# Patient Record
Sex: Female | Born: 1985 | Race: Black or African American | Hispanic: No | Marital: Single
Health system: Midwestern US, Community
[De-identification: ages and names within clinical notes are randomized; demographics above are authoritative.]

## PROBLEM LIST (undated history)

## (undated) DIAGNOSIS — I10 Essential (primary) hypertension: Secondary | ICD-10-CM

## (undated) DIAGNOSIS — E7800 Pure hypercholesterolemia, unspecified: Secondary | ICD-10-CM

## (undated) DIAGNOSIS — N83209 Unspecified ovarian cyst, unspecified side: Secondary | ICD-10-CM

---
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
## 2009-05-02 ENCOUNTER — Emergency Department (HOSPITAL_COMMUNITY): Admission: EM | Admit: 2009-05-02 | Discharge: 2009-05-02 | Payer: Self-pay | Admitting: Emergency Medicine

## 2009-07-20 ENCOUNTER — Emergency Department (HOSPITAL_COMMUNITY): Admission: EM | Admit: 2009-07-20 | Discharge: 2009-07-20 | Payer: Self-pay | Admitting: Emergency Medicine

## 2009-09-28 ENCOUNTER — Emergency Department (HOSPITAL_COMMUNITY): Admission: EM | Admit: 2009-09-28 | Discharge: 2009-09-29 | Payer: Self-pay | Admitting: Emergency Medicine

## 2009-09-28 ENCOUNTER — Emergency Department (HOSPITAL_COMMUNITY): Admission: EM | Admit: 2009-09-28 | Discharge: 2009-09-28 | Payer: Self-pay | Admitting: Emergency Medicine

## 2009-10-03 ENCOUNTER — Emergency Department (HOSPITAL_COMMUNITY): Admission: EM | Admit: 2009-10-03 | Discharge: 2009-10-03 | Payer: Self-pay | Admitting: Emergency Medicine

## 2010-09-13 LAB — URINALYSIS, ROUTINE W REFLEX MICROSCOPIC
Bilirubin Urine: NEGATIVE
Glucose, UA: NEGATIVE mg/dL
Ketones, ur: NEGATIVE mg/dL
Nitrite: NEGATIVE
Specific Gravity, Urine: 1.017 (ref 1.005–1.030)
Urobilinogen, UA: 0.2 mg/dL (ref 0.0–1.0)

## 2010-09-13 LAB — POCT PREGNANCY, URINE: Preg Test, Ur: NEGATIVE

## 2010-09-13 LAB — WET PREP, GENITAL
Clue Cells Wet Prep HPF POC: NONE SEEN
Trich, Wet Prep: NONE SEEN

## 2010-09-13 LAB — GC/CHLAMYDIA PROBE AMP, GENITAL: Chlamydia, DNA Probe: NEGATIVE

## 2010-09-13 LAB — RPR: RPR Ser Ql: NONREACTIVE

## 2010-09-27 LAB — WET PREP, GENITAL: Yeast Wet Prep HPF POC: NONE SEEN

## 2010-09-27 LAB — URINE MICROSCOPIC-ADD ON

## 2010-09-27 LAB — GC/CHLAMYDIA PROBE AMP, GENITAL
Chlamydia, DNA Probe: NEGATIVE
GC Probe Amp, Genital: NEGATIVE

## 2010-09-27 LAB — RAPID STREP SCREEN (MED CTR MEBANE ONLY): Streptococcus, Group A Screen (Direct): POSITIVE — AB

## 2010-09-27 LAB — URINALYSIS, ROUTINE W REFLEX MICROSCOPIC
Protein, ur: 30 mg/dL — AB
Urobilinogen, UA: 1 mg/dL (ref 0.0–1.0)

## 2010-09-27 LAB — POCT PREGNANCY, URINE: Preg Test, Ur: NEGATIVE

## 2010-12-07 ENCOUNTER — Emergency Department (HOSPITAL_COMMUNITY)
Admission: EM | Admit: 2010-12-07 | Discharge: 2010-12-07 | Disposition: A | Discharge status: Home / Self Care | Payer: Self-pay | Attending: Emergency Medicine | Admitting: Emergency Medicine

## 2010-12-07 DIAGNOSIS — N751 Abscess of Bartholin's gland: Secondary | ICD-10-CM | POA: Insufficient documentation

## 2010-12-07 DIAGNOSIS — K029 Dental caries, unspecified: Secondary | ICD-10-CM | POA: Insufficient documentation

## 2010-12-07 DIAGNOSIS — K089 Disorder of teeth and supporting structures, unspecified: Secondary | ICD-10-CM | POA: Insufficient documentation

## 2010-12-07 DIAGNOSIS — E119 Type 2 diabetes mellitus without complications: Secondary | ICD-10-CM | POA: Insufficient documentation

## 2010-12-07 LAB — URINALYSIS, ROUTINE W REFLEX MICROSCOPIC
Glucose, UA: NEGATIVE mg/dL
Ketones, ur: NEGATIVE mg/dL
Protein, ur: NEGATIVE mg/dL
Urobilinogen, UA: 0.2 mg/dL (ref 0.0–1.0)
pH: 5.5 (ref 5.0–8.0)

## 2010-12-07 LAB — POCT PREGNANCY, URINE: Preg Test, Ur: NEGATIVE

## 2011-04-23 ENCOUNTER — Emergency Department (HOSPITAL_COMMUNITY)
Admission: EM | Admit: 2011-04-23 | Discharge: 2011-04-23 | Disposition: A | Discharge status: Home / Self Care | Payer: Self-pay | Attending: Emergency Medicine | Admitting: Emergency Medicine

## 2011-04-23 DIAGNOSIS — N390 Urinary tract infection, site not specified: Secondary | ICD-10-CM | POA: Insufficient documentation

## 2011-04-23 DIAGNOSIS — R300 Dysuria: Secondary | ICD-10-CM | POA: Insufficient documentation

## 2011-04-23 DIAGNOSIS — J45909 Unspecified asthma, uncomplicated: Secondary | ICD-10-CM | POA: Insufficient documentation

## 2011-04-23 LAB — URINALYSIS, ROUTINE W REFLEX MICROSCOPIC
Glucose, UA: NEGATIVE mg/dL
Ketones, ur: NEGATIVE mg/dL
Protein, ur: NEGATIVE mg/dL
Specific Gravity, Urine: 1.02 (ref 1.005–1.030)
Urobilinogen, UA: 0.2 mg/dL (ref 0.0–1.0)
pH: 5.5 (ref 5.0–8.0)

## 2015-08-22 ENCOUNTER — Emergency Department (HOSPITAL_COMMUNITY)
Admission: EM | Admit: 2015-08-22 | Discharge: 2015-08-22 | Disposition: A | Discharge status: Home / Self Care | Payer: Self-pay | Attending: Emergency Medicine | Admitting: Emergency Medicine

## 2015-08-22 ENCOUNTER — Encounter (HOSPITAL_COMMUNITY): Payer: Self-pay | Admitting: Family Medicine

## 2015-08-22 DIAGNOSIS — N750 Cyst of Bartholin's gland: Secondary | ICD-10-CM | POA: Insufficient documentation

## 2015-08-22 DIAGNOSIS — F17210 Nicotine dependence, cigarettes, uncomplicated: Secondary | ICD-10-CM | POA: Insufficient documentation

## 2015-08-22 MED ORDER — TRAMADOL HCL 50 MG PO TABS: 50.0000 mg | ORAL_TABLET | Freq: Four times a day (QID) | ORAL | Status: DC | PRN

## 2015-08-22 MED ORDER — LIDOCAINE-EPINEPHRINE 2 %-1:100000 IJ SOLN
20.0000 mL | Freq: Once | INTRAMUSCULAR | Status: DC
  Filled 2015-08-22: qty 1

## 2015-08-22 MED ORDER — CEPHALEXIN 500 MG PO CAPS: 500.0000 mg | ORAL_CAPSULE | Freq: Four times a day (QID) | ORAL | Status: DC

## 2015-08-22 MED ORDER — CEPHALEXIN 500 MG PO CAPS: 500.0000 mg | ORAL_CAPSULE | Freq: Two times a day (BID) | ORAL | Status: DC

## 2015-08-22 NOTE — ED Notes (Signed)
Patient reports having an abscess to her left groin since Thursday. Denies fever or drainage. Pt reports she has not took any medication for the symptoms.

## 2015-08-22 NOTE — ED Provider Notes (Signed)
CSN: [id number]     Arrival date & time 08/22/15  0243 History   First MD Initiated Contact with Patient 08/22/15 [phone]     Chief Complaint  Patient presents with  . Abscess     (Consider location/radiation/quality/duration/timing/severity/associated sxs/prior Treatment) HPI   Cathy Reid is a 30 y.o. female  PCP: No PCP Per Patient  Blood pressure 137/68, pulse 86, temperature 98.9 F (37.2 C), temperature source Oral, resp. rate 20, height  (1.6 m), SpO2 100 %.  SIGNIFICANT PMH: none CHIEF COMPLAINT: bump on left labia  When: Since this past thursday Mechanism: insidious Chronicity: first time Location: left labia Radiation: none Quality and severity: severe pain Treatments tried: soaking in sitz baths Alleviating factors: not touching the area Worsening factors: touching the area Associated Symptoms: pain Risk Factors: shaving her inguinal region  Negative ROS: Confusion, diaphoresis, fever, headache, weakness (general or focal), change of vision,  neck pain, dysphagia, aphagia, chest pain, shortness of breath,  back pain, abdominal pains, nausea, vomiting, diarrhea, lower extremity swelling, rash.   History reviewed. No pertinent past medical history. History reviewed. No pertinent past surgical history. History reviewed. No pertinent family history. Social History  Substance Use Topics  . Smoking status: Current Every Day Smoker    Types: Cigarettes  . Smokeless tobacco: None  . Alcohol Use: No   OB History    No data available     Review of Systems  Review of Systems All other systems negative except as documented in the HPI. All pertinent positives and negatives as reviewed in the HPI.   Allergies  Review of patient's allergies indicates no known allergies.  Home Medications   Prior to Admission medications   Medication Sig Start Date End Date Taking? Authorizing Provider  cephALEXin (KEFLEX) 500 MG capsule Take 1 capsule (500 mg total)  by mouth 2 (two) times daily. 08/22/15   Willford Rabideau Neva Seat, PA-C  traMADol (ULTRAM) 50 MG tablet Take 1 tablet (50 mg total) by mouth every 6 (six) hours as needed. 08/22/15   Sherod Cisse Neva Seat, PA-C   BP 137/68 mmHg  Pulse 86  Temp(Src) 98.9 F (37.2 C) (Oral)  Resp 20  Ht  (1.6 m)  SpO2 100%  LMP  Physical Exam  Constitutional: She appears well-developed and well-nourished. No distress.  HENT:  Head: Normocephalic and atraumatic.  Eyes: Pupils are equal, round, and reactive to light.  Neck: Normal range of motion. Neck supple.  Cardiovascular: Normal rate and regular rhythm.   Pulmonary/Chest: Effort normal.  Abdominal: Soft.  Genitourinary:     Neurological: She is alert.  Skin: Skin is warm and dry.  Nursing note and vitals reviewed.   ED Course  Procedures (including critical care time) Labs Review Labs Reviewed - No data to display  Imaging Review No results found. I have personally reviewed and evaluated these images and lab results as part of my medical decision-making.   EKG Interpretation None      MDM   Final diagnoses:  Bartholin cyst    INCISION AND DRAINAGE Performed by: Dorthula Matas Consent: Verbal consent obtained. Risks and benefits: risks, benefits and alternatives were discussed Type: abscess  Body area: left labia majora  Anesthesia: local infiltration  Incision was made with a scalpel.  Local anesthetic: lidocaine 2% with epinephrine  Anesthetic total: 2 ml  Complexity: complex Blunt dissection to break up loculations  Drainage: purulent  Drainage amount: moderate, 3 cc's  Packing materialnot large enough for packing  Patient  tolerance: Patient tolerated the procedure well with no immediate complications.  Patient started on antibiotic and given a prescription for pain medicine. She is getting given a referral to [facility] and wound care instructions. She is also been given strict return to emergency Department  precautions.   Marlon Pel, PA-C 08/22/15 0502  Derwood Kaplan, MD 08/22/15 1610

## 2015-08-22 NOTE — ED Notes (Signed)
Provider in room  

## 2015-08-22 NOTE — ED Notes (Signed)
Bed: WA06 Expected date:  Expected time:  Means of arrival:  Comments: 

## 2015-08-22 NOTE — Discharge Instructions (Signed)
Bartholin Cyst or Abscess A Bartholin cyst is a fluid-filled sac that forms on a Bartholin gland. Bartholin glands are small glands that are located within the folds of skin (labia) along the sides of the lower opening of the vagina. These glands produce a fluid to moisten the outside of the vagina during sexual intercourse. A Bartholin cyst causes a bulge on the side of the vagina. A cyst that is not large or infected may not cause symptoms or problems. However, if the fluid within the cyst becomes infected, the cyst can turn into an abscess. An abscess may cause discomfort or pain. CAUSES A Bartholin cyst may develop when the duct of the gland becomes blocked. In many cases, the cause of this is not known. Various kinds of bacteria can cause the cyst to become infected and develop into an abscess. RISK FACTORS You may be at an increased risk of developing a Bartholin cyst or abscess if:  You are a woman of reproductive age.  You have a history of previous Bartholin cysts or abscesses.  You have diabetes.  You have a sexually transmitted disease (STD). SIGNS AND SYMPTOMS The severity of symptoms varies depending on the size of the cyst and whether it is infected. Symptoms may include:  A bulge or swelling near the lower opening of your vagina.  Discomfort or pain.  Redness.  Pain during sexual intercourse.  Pain when walking.  Fluid draining from the area. DIAGNOSIS Your health care provider may make a diagnosis based on your symptoms and a physical exam. He or she will look for swelling in your vaginal area. Blood tests may be done to check for infections. A sample of fluid from the cyst or abscess may also be taken to be tested in a lab. TREATMENT Small cysts that are not infected may not require any treatment. These often go away on their own. Yourhealth care provider will recommend hot baths and the use of warm compresses. These may also be part of the treatment for an abscess.  Treatment options for a large cyst or abscess may include:   Antibiotic medicine.  A surgical procedure to drain the abscess. One of the following procedures may be done:  Incision and drainage. An incision is made in the cyst or abscess so that the fluid drains out. A catheter may be placed inside the cyst so that it does not close and fill up with fluid again. The catheter will be removed after you have a follow-up visit with a specialist (gynecologist).  Marsupialization. The cyst or abscess is opened and kept open by stitching the edges of the skin to the walls of the cyst or abscess. This allows it to continue to drain and not fill up with fluid again. If you have cysts or abscesses that keep returning and have required incision and drainage multiple times, your health care provider may talk to you about surgery to remove the Bartholin gland. HOME CARE INSTRUCTIONS  Take medicines only as directed by your health care provider.  If you were prescribed an antibiotic medicine, finish it all even if you start to feel better.  Apply warm, wet compresses to the area or take warm, shallow baths that cover your pelvic region (sitz baths) several times a day or as directed by your health care provider.  Do not squeeze the cyst or apply heavy pressure to it.  Do not have sexual intercourse until the cyst has gone away.  If your cyst or abscess was   opened, a small piece of gauze or a drain may have been placed in the area to allow drainage. Do not remove the gauze or the drain until directed by your health care provider.  Wear feminine pads--not tampons--as needed for any drainage or bleeding.  Keep all follow-up visits as directed by your health care provider. This is important. PREVENTION Take these steps to help prevent a Bartholin cyst from returning:  Practice good hygiene.   Clean your vaginal area with mild soap and a soft cloth when you bathe.  Practice safe sex to prevent  STDs. SEEK MEDICAL CARE IF:  You have increased pain, swelling, or redness in the area of the cyst.  Puslike drainage is coming from the cyst.  You have a fever.   This information is not intended to replace advice given to you by your health care provider. Make sure you discuss any questions you have with your health care provider.   Document Released: 06/11/2005 Document Revised: 07/02/2014 Document Reviewed: 01/25/2014 Elsevier Interactive Patient Education 2016 [employer]  

## 2015-11-09 ENCOUNTER — Emergency Department (HOSPITAL_COMMUNITY): Payer: Self-pay

## 2015-11-09 ENCOUNTER — Encounter (HOSPITAL_COMMUNITY): Payer: Self-pay | Admitting: Emergency Medicine

## 2015-11-09 ENCOUNTER — Emergency Department (HOSPITAL_COMMUNITY)
Admission: EM | Admit: 2015-11-09 | Discharge: 2015-11-09 | Disposition: A | Discharge status: Home / Self Care | Payer: Self-pay | Attending: Emergency Medicine | Admitting: Emergency Medicine

## 2015-11-09 DIAGNOSIS — R0789 Other chest pain: Secondary | ICD-10-CM | POA: Insufficient documentation

## 2015-11-09 DIAGNOSIS — F17210 Nicotine dependence, cigarettes, uncomplicated: Secondary | ICD-10-CM | POA: Insufficient documentation

## 2015-11-09 DIAGNOSIS — Z792 Long term (current) use of antibiotics: Secondary | ICD-10-CM | POA: Insufficient documentation

## 2015-11-09 LAB — COMPREHENSIVE METABOLIC PANEL
ALT: 10 U/L — ABNORMAL LOW (ref 14–54)
AST: 15 U/L (ref 15–41)
Albumin: 3.5 g/dL (ref 3.5–5.0)
Alkaline Phosphatase: 47 U/L (ref 38–126)
Anion gap: 10 (ref 5–15)
BUN: 7 mg/dL (ref 6–20)
CO2: 23 mmol/L (ref 22–32)
Calcium: 8.9 mg/dL (ref 8.9–10.3)
Chloride: 105 mmol/L (ref 101–111)
Creatinine, Ser: 0.93 mg/dL (ref 0.44–1.00)
GFR calc Af Amer: >60 mL/min (ref >60)
GFR calc non Af Amer: >60 mL/min (ref >60)
Glucose, Bld: 84 mg/dL (ref 65–99)
Potassium: 4.1 mmol/L (ref 3.5–5.1)
Sodium: 138 mmol/L (ref 135–145)
Total Bilirubin: 0.4 mg/dL (ref 0.3–1.2)
Total Protein: 6.9 g/dL (ref 6.5–8.1)

## 2015-11-09 LAB — CBC
HCT: 32.8 % — ABNORMAL LOW (ref 36.0–46.0)
Hemoglobin: 9.9 g/dL — ABNORMAL LOW (ref 12.0–15.0)
MCH: 18.1 pg — ABNORMAL LOW (ref 26.0–34.0)
MCHC: 30.2 g/dL (ref 30.0–36.0)
MCV: 59.9 fL — ABNORMAL LOW (ref 78.0–100.0)
Platelets: 400 10*3/uL (ref 150–400)
RBC: 5.48 MIL/uL — ABNORMAL HIGH (ref 3.87–5.11)
RDW: 19.8 % — ABNORMAL HIGH (ref 11.5–15.5)
WBC: 19 10*3/uL — ABNORMAL HIGH (ref 4.0–10.5)

## 2015-11-09 LAB — I-STAT TROPONIN, ED: Troponin i, poc: 0 ng/mL (ref 0.00–0.08)

## 2015-11-09 LAB — LIPASE, BLOOD: Lipase: 45 U/L (ref 11–51)

## 2015-11-09 MED ORDER — PANTOPRAZOLE SODIUM 20 MG PO TBEC: 20.0000 mg | DELAYED_RELEASE_TABLET | Freq: Two times a day (BID) | ORAL | Status: DC

## 2015-11-09 NOTE — ED Notes (Signed)
Per EMS, pt has been having epigastric pain radiating to her chest x 3 days. Pt alert x 4, NAD at this time. Pt took 324 of aspirin at home.

## 2015-11-09 NOTE — ED Provider Notes (Signed)
CSN: [id number]     Arrival date & time 11/09/15  1712 History   First MD Initiated Contact with Patient 11/09/15 2113     Chief Complaint  Patient presents with  . Abdominal Pain     (Consider location/radiation/quality/duration/timing/severity/associated sxs/prior Treatment) HPI   29yF with CP. Onset about 3 days ago. Burning/aching pain in epigastrium to mid sternum. Fairly constant since onset. No appreciable exacerbating or relieving factors. No respiratory complaints. No n/v. No unusual leg pain or swelling.   History reviewed. No pertinent past medical history. History reviewed. No pertinent past surgical history. No family history on file. Social History  Substance Use Topics  . Smoking status: Current Every Day Smoker    Types: Cigarettes  . Smokeless tobacco: None  . Alcohol Use: No   OB History    No data available     Review of Systems  All systems reviewed and negative, other than as noted in HPI.   Allergies  Review of patient's allergies indicates no known allergies.  Home Medications   Prior to Admission medications   Medication Sig Start Date End Date Taking? Authorizing Provider  aspirin EC 81 MG tablet Take 324 mg by mouth every 6 (six) hours as needed for moderate pain.   Yes Historical Provider, MD  cephALEXin (KEFLEX) 500 MG capsule Take 1 capsule (500 mg total) by mouth 2 (two) times daily. 08/22/15   Tiffany Neva Seat, PA-C  pantoprazole (PROTONIX) 20 MG tablet Take 1 tablet (20 mg total) by mouth 2 (two) times daily before a meal. 11/09/15   Raeford Razor, MD  traMADol (ULTRAM) 50 MG tablet Take 1 tablet (50 mg total) by mouth every 6 (six) hours as needed. 08/22/15   Tiffany Neva Seat, PA-C   BP 121/59 mmHg  Pulse 74  Temp(Src) 99.2 F (37.3 C) (Oral)  Resp 16  SpO2 99%  LMP 09/26/2015 (Exact Date) Physical Exam  Constitutional: She appears well-developed and well-nourished. No distress.  HENT:  Head: Normocephalic and atraumatic.  Eyes:  Conjunctivae are normal. Right eye exhibits no discharge. Left eye exhibits no discharge.  Neck: Neck supple.  Cardiovascular: Normal rate, regular rhythm and normal heart sounds.  Exam reveals no gallop and no friction rub.   No murmur heard. Pulmonary/Chest: Effort normal and breath sounds normal. No respiratory distress. She exhibits no tenderness.  Abdominal: Soft. She exhibits no distension. There is no tenderness.  Musculoskeletal: She exhibits no edema or tenderness.  Lower extremities symmetric as compared to each other. No calf tenderness. Negative Homan's. No palpable cords.   Neurological: She is alert.  Skin: Skin is warm and dry.  Psychiatric: She has a normal mood and affect. Her behavior is normal. Thought content normal.  Nursing note and vitals reviewed.   ED Course  Procedures (including critical care time) Labs Review Labs Reviewed  COMPREHENSIVE METABOLIC PANEL - Abnormal; Notable for the following:    ALT 10 (*)    All other components within normal limits  CBC - Abnormal; Notable for the following:    WBC 19.0 (*)    RBC 5.48 (*)    Hemoglobin 9.9 (*)    HCT 32.8 (*)    MCV 59.9 (*)    MCH 18.1 (*)    RDW 19.8 (*)    All other components within normal limits  LIPASE, BLOOD  I-STAT TROPOININ, ED    Imaging Review Dg Chest 2 View  11/09/2015  CLINICAL DATA:  Epigastric pain radiating to chest for 3 days,  chest pain, smoker EXAM: CHEST  2 VIEW COMPARISON:  09/28/2009 FINDINGS: Normal heart size and pulmonary vascularity. Stable small epicardial fat pad at RIGHT cardiophrenic angle. Mediastinal contours otherwise normal. Lungs clear. No pleural effusion or pneumothorax. Bones unremarkable. IMPRESSION: No acute abnormalities. Electronically Signed   By: Ulyses SouthwardMark  Boles M.D.   On: 11/09/2015 18:28   I have personally reviewed and evaluated these images and lab results as part of my medical decision-making.   EKG Interpretation None      MDM   Final  diagnoses:  Chest discomfort    29yF with CP. Doubt ACS, PE, dissection or other emergent process. Suspect may be gerd. Trial of PPI. It has been determined that no acute conditions requiring further emergency intervention are present at this time. The patient has been advised of the diagnosis and plan. I reviewed any labs and imaging including any potential incidental findings. We have discussed signs and symptoms that warrant return to the ED and they are listed in the discharge instructions.      Raeford RazorStephen Atlas Crossland, MD 11/18/15 [phone]

## 2015-11-09 NOTE — ED Notes (Signed)
MD at bedside. 

## 2016-02-09 ENCOUNTER — Encounter (HOSPITAL_COMMUNITY): Payer: Self-pay

## 2016-02-09 ENCOUNTER — Emergency Department (HOSPITAL_COMMUNITY)
Admission: EM | Admit: 2016-02-09 | Discharge: 2016-02-10 | Disposition: A | Discharge status: Home / Self Care | Payer: Self-pay | Attending: Emergency Medicine | Admitting: Emergency Medicine

## 2016-02-09 DIAGNOSIS — F17210 Nicotine dependence, cigarettes, uncomplicated: Secondary | ICD-10-CM | POA: Insufficient documentation

## 2016-02-09 DIAGNOSIS — Z791 Long term (current) use of non-steroidal anti-inflammatories (NSAID): Secondary | ICD-10-CM | POA: Insufficient documentation

## 2016-02-09 DIAGNOSIS — E876 Hypokalemia: Secondary | ICD-10-CM | POA: Insufficient documentation

## 2016-02-09 DIAGNOSIS — N939 Abnormal uterine and vaginal bleeding, unspecified: Secondary | ICD-10-CM | POA: Insufficient documentation

## 2016-02-09 DIAGNOSIS — R109 Unspecified abdominal pain: Secondary | ICD-10-CM | POA: Insufficient documentation

## 2016-02-09 DIAGNOSIS — M545 Low back pain, unspecified: Secondary | ICD-10-CM

## 2016-02-09 LAB — I-STAT CHEM 8, ED
BUN: 4 mg/dL — ABNORMAL LOW (ref 6–20)
CALCIUM ION: 1.21 mmol/L (ref 1.13–1.30)
CREATININE: 1.1 mg/dL — AB (ref 0.44–1.00)
Chloride: 104 mmol/L (ref 101–111)
GLUCOSE: 84 mg/dL (ref 65–99)
HEMATOCRIT: 33 % — AB (ref 36.0–46.0)
HEMOGLOBIN: 11.2 g/dL — AB (ref 12.0–15.0)
Potassium: 3.4 mmol/L — ABNORMAL LOW (ref 3.5–5.1)
Sodium: 142 mmol/L (ref 135–145)
TCO2: 25 mmol/L (ref 0–100)

## 2016-02-09 LAB — URINALYSIS, ROUTINE W REFLEX MICROSCOPIC
BILIRUBIN URINE: NEGATIVE
Glucose, UA: NEGATIVE mg/dL
Ketones, ur: NEGATIVE mg/dL
Leukocytes, UA: NEGATIVE
Nitrite: NEGATIVE
PH: 5.5 (ref 5.0–8.0)
Protein, ur: 30 mg/dL — AB
SPECIFIC GRAVITY, URINE: 1.023 (ref 1.005–1.030)

## 2016-02-09 LAB — WET PREP, GENITAL
Clue Cells Wet Prep HPF POC: NONE SEEN
SPERM: NONE SEEN
Trich, Wet Prep: NONE SEEN
Yeast Wet Prep HPF POC: NONE SEEN

## 2016-02-09 LAB — I-STAT BETA HCG BLOOD, ED (MC, WL, AP ONLY): I-stat hCG, quantitative: <5 m[IU]/mL (ref <5)

## 2016-02-09 LAB — URINE MICROSCOPIC-ADD ON

## 2016-02-09 MED ORDER — POTASSIUM CHLORIDE CRYS ER 20 MEQ PO TBCR
40.0000 meq | EXTENDED_RELEASE_TABLET | Freq: Once | ORAL | Status: AC
  Administered 2016-02-10: 40 meq via ORAL
  Filled 2016-02-09: qty 2

## 2016-02-09 NOTE — ED Provider Notes (Signed)
[facility]-EMERGENCY DEPT Provider Note   CSN: [id number] Arrival date & time: 02/09/16  1815  By signing my name below, I, Linna DarnerRussell Turner, attest that this documentation has been prepared under the direction and in the presence of TXU CorpHannah Ford Peddie, PA-C. Electronically Signed: Linna Darnerussell Turner, Scribe. 02/09/2016. 10:35 PM.  History   Chief Complaint Chief Complaint  Patient presents with  . Back Pain  . Vaginal Bleeding     The history is provided by the patient and medical records. No language interpreter was used.     HPI Comments: Cathy Reid is a 30 y.o. female who presents to the Emergency Department complaining of constant, severe, vaginal bleeding for the last 4 days. She also reports bilateral lower back pain since onset. She notes a h/o irregular periods. Pt reports abdominal pain since earlier today, she took ibuprofen x4 with good relief of her abdominal pain. Pt also notes urinary frequency, restless legs while trying to sleep, and fatigue for the last several days. She states she has been sleeping more than usual lately. She reports her vaginal bleeding is profuse and states this is not normal. Pt notes she saw a doctor for her irregular periods a couple of years ago. She reports she has used birth control for her irregular periods in the past, but it made her feel dizzy and lightheaded so she stopped taking it. Pt states she is sexually active and does not use protection. She denies dysuria, numbness/tingling in her legs, vaginal discharge, or any other associated symptoms  History reviewed. No pertinent past medical history.  There are no active problems to display for this patient.   History reviewed. No pertinent surgical history.  OB History    No data available       Home Medications    Prior to Admission medications   Medication Sig Start Date End Date Taking? Authorizing Provider  ibuprofen (ADVIL,MOTRIN) 200 MG tablet Take 800 mg by mouth every 6 (six)  hours as needed for moderate pain.   Yes Historical Provider, MD    Family History No family history on file.  Social History Social History  Substance Use Topics  . Smoking status: Current Every Day Smoker    Types: Cigarettes  . Smokeless tobacco: Not on file  . Alcohol use No     Allergies   Ciprofloxacin   Review of Systems Review of Systems  Constitutional: Positive for activity change (sleeping more) and fatigue.  Gastrointestinal: Positive for abdominal pain.  Genitourinary: Positive for frequency and vaginal bleeding. Negative for dysuria and vaginal discharge.  Musculoskeletal: Positive for back pain (lower, bilateral).  Neurological: Negative for numbness.  All other systems reviewed and are negative.   Physical Exam Updated Vital Signs BP 156/61 (BP Location: Right Arm)   Pulse 83   Temp 98 F (36.7 C)   Resp 20   Ht 5\' 3"  (1.6 m)   Wt 90.7 kg   LMP 02/09/2016 (Approximate)   SpO2 100%   BMI 35.43 kg/m   Physical Exam  Constitutional: She appears well-developed and well-nourished. No distress.  Awake, alert, nontoxic appearance  HENT:  Head: Normocephalic and atraumatic.  Mouth/Throat: Oropharynx is clear and moist. No oropharyngeal exudate.  Eyes: Conjunctivae are normal. No scleral icterus.  Neck: Normal range of motion. Neck supple.  Full ROM without pain  Cardiovascular: Normal rate, regular rhythm, normal heart sounds and intact distal pulses.   No murmur heard. Pulmonary/Chest: Effort normal and breath sounds normal. No respiratory distress. She  has no wheezes.  Equal chest expansion  Abdominal: Soft. Bowel sounds are normal. She exhibits no distension and no mass. There is no tenderness. There is no rebound, no guarding and no CVA tenderness. Hernia confirmed negative in the right inguinal area and confirmed negative in the left inguinal area.  Genitourinary: Uterus normal. No labial fusion. There is no rash, tenderness or lesion on the  right labia. There is no rash, tenderness or lesion on the left labia. Uterus is not deviated, not enlarged, not fixed and not tender. Cervix exhibits no motion tenderness, no discharge and no friability. Right adnexum displays no mass, no tenderness and no fullness. Left adnexum displays no mass, no tenderness and no fullness. There is bleeding (small, no clots noted) in the vagina. No erythema or tenderness in the vagina. No foreign body in the vagina. No signs of injury around the vagina. No vaginal discharge found.  Musculoskeletal: Normal range of motion. She exhibits no edema.  Full range of motion of the T-spine and L-spine No midline tenderness to the  T-spine or L-spine Mild Tenderness to palpation of the paraspinous muscles of the T-spine and L-spine  Lymphadenopathy:    She has no cervical adenopathy.       Right: No inguinal adenopathy present.       Left: No inguinal adenopathy present.  Neurological: She is alert. She has normal reflexes.  Reflex Scores:      Bicep reflexes are 2+ on the right side and 2+ on the left side.      Brachioradialis reflexes are 2+ on the right side and 2+ on the left side.      Patellar reflexes are 2+ on the right side and 2+ on the left side.      Achilles reflexes are 2+ on the right side and 2+ on the left side. Speech is clear and goal oriented Moves extremities without ataxia  Skin: Skin is warm and dry. No rash noted. She is not diaphoretic. No erythema.  Psychiatric: She has a normal mood and affect. Her behavior is normal.  Nursing note and vitals reviewed.   ED Treatments / Results  Labs (all labs ordered are listed, but only abnormal results are displayed) Labs Reviewed  WET PREP, GENITAL - Abnormal; Notable for the following:       Result Value   WBC, Wet Prep HPF POC MODERATE (*)    All other components within normal limits  URINALYSIS, ROUTINE W REFLEX MICROSCOPIC (NOT AT [facility]) - Abnormal; Notable for the following:    Hgb urine  dipstick LARGE (*)    Protein, ur 30 (*)    All other components within normal limits  URINE MICROSCOPIC-ADD ON - Abnormal; Notable for the following:    Squamous Epithelial / LPF 0-5 (*)    Bacteria, UA FEW (*)    All other components within normal limits  I-STAT CHEM 8, ED - Abnormal; Notable for the following:    Potassium 3.4 (*)    BUN 4 (*)    Creatinine, Ser 1.10 (*)    Hemoglobin 11.2 (*)    HCT 33.0 (*)    All other components within normal limits  RPR  I-STAT BETA HCG BLOOD, ED ([facility], [facility], [facility] ONLY)  GC/CHLAMYDIA PROBE AMP ([place]) NOT AT [facility]    Procedures Procedures (including critical care time)  DIAGNOSTIC STUDIES: Oxygen Saturation is 100% on RA, normal by my interpretation.    COORDINATION OF CARE: 10:35 PM Discussed treatment plan  with pt at bedside and pt agreed to plan.  Medications Ordered in ED Medications  potassium chloride SA (K-DUR,KLOR-CON) CR tablet 40 mEq (not administered)     Initial Impression / Assessment and Plan / ED Course  I have reviewed the triage vital signs and the nursing notes.  Pertinent labs & imaging results that were available during my care of the patient were reviewed by me and considered in my medical decision making (see chart for details).  Clinical Course  Value Comment By Time  Potassium: (!) 3.4 Mild hypokalemia. Repleted in the department. [place]ia Client Deshia Vanderhoof, PA-C 08/17 2345  Creatinine: (!) 1.10 Slightly elevated creatinine, likely due to some decreased water intake. Discussed with patient importance of adequate water hydration. [place]ahlia Client Zenas Santa, PA-C 08/17 2345  Hemoglobin: (!) 11.2 Mild anemia, expected after persistent vaginal bleeding. [facility]ia Client Dymond Gutt, PA-C 08/17 2346  I-stat hCG, quantitative: <5.0 Pregnancy test negative Dierdre Forth, PA-C 08/17 2346  Nitrite: NEGATIVE No evidence of UTI Dierdre Forth, PA-C 08/17 2346  WBC, Wet Prep HPF POC: (!) MODERATE Moderate white blood cells seen  however no vaginal discharge, friable cervix or other clinical signs of infection. Will await cultures prior to STD treatment. Patient declined HIV screening in spite of my recommendation. [place]ia Client Mairen Wallenstein, PA-C 08/17 2346  Pulse Rate: 72 No fever or tachycardia. Mild hypertension noted. Patient is to follow with primary care for this. [place]ia Client Khristin Keleher, PA-C 08/17 2346    Pt with Complaint of vaginal bleeding and low back pain. No evidence of UTI. No CVA tenderness to suggest pyelonephritis. Normal neurologic exam. Patient ambulates without difficulty.    Minimal vaginal bleeding on physical exam. No clots in the vaginal vault. No evidence of vaginal infection. No vaginal discharge. Cultures for syphilis, gonorrhea and chlamydia are pending. Will hold on treatment until they have returned. Discussed with patient the importance of follow-up with OB/GYN for further evaluation of her vaginal bleeding. Mild anemia as expected.  I personally performed the services described in this documentation, which was scribed in my presence. The recorded information has been reviewed and is accurate.   Final Clinical Impressions(s) / ED Diagnoses   Final diagnoses:  Hypokalemia  Vaginal bleeding  Bilateral low back pain without sciatica    New Prescriptions Current Discharge Medication List       Dierdre Forth, PA-C 02/09/16 2348    Bethann Berkshire, MD 02/10/16 [phone]

## 2016-02-09 NOTE — Discharge Instructions (Signed)
1. Medications: usual home medications; take ibuprofen 800mg  up to 3x per day for back pain. 2. Treatment: rest, drink plenty of fluids, eat foods rich in potassium;   3. Follow Up: Please followup with your primary doctor and/or OB/GYN in 7 days for discussion of your diagnoses and further evaluation after today's visit; if you do not have a primary care doctor use the resource guide provided to find one; Please return to the ER for worsening symptoms.

## 2016-02-09 NOTE — ED Triage Notes (Signed)
Pt presents with c/o back pain for the past 4 days and heavy vaginal bleeding. Pt reports she has been bleeding on and off for about a year and this is normal for her but over the last week, the bleeding has been heavier and she has been passing some clots. NAD at this time.

## 2016-02-10 LAB — RPR: RPR: NONREACTIVE

## 2016-02-10 LAB — GC/CHLAMYDIA PROBE AMP (~~LOC~~) NOT AT ARMC
CHLAMYDIA, DNA PROBE: NEGATIVE
NEISSERIA GONORRHEA: NEGATIVE

## 2017-01-27 ENCOUNTER — Emergency Department (HOSPITAL_COMMUNITY)
Admission: EM | Admit: 2017-01-27 | Discharge: 2017-01-27 | Disposition: A | Discharge status: Home / Self Care | Payer: Self-pay | Attending: Emergency Medicine | Admitting: Emergency Medicine

## 2017-01-27 ENCOUNTER — Encounter (HOSPITAL_COMMUNITY): Payer: Self-pay | Admitting: *Deleted

## 2017-01-27 ENCOUNTER — Emergency Department (HOSPITAL_COMMUNITY): Payer: Self-pay

## 2017-01-27 ENCOUNTER — Other Ambulatory Visit: Payer: Self-pay

## 2017-01-27 DIAGNOSIS — X500XXA Overexertion from strenuous movement or load, initial encounter: Secondary | ICD-10-CM | POA: Insufficient documentation

## 2017-01-27 DIAGNOSIS — I10 Essential (primary) hypertension: Secondary | ICD-10-CM | POA: Insufficient documentation

## 2017-01-27 DIAGNOSIS — Y9389 Activity, other specified: Secondary | ICD-10-CM | POA: Insufficient documentation

## 2017-01-27 DIAGNOSIS — F17210 Nicotine dependence, cigarettes, uncomplicated: Secondary | ICD-10-CM | POA: Insufficient documentation

## 2017-01-27 DIAGNOSIS — Y9289 Other specified places as the place of occurrence of the external cause: Secondary | ICD-10-CM | POA: Insufficient documentation

## 2017-01-27 DIAGNOSIS — S161XXA Strain of muscle, fascia and tendon at neck level, initial encounter: Secondary | ICD-10-CM | POA: Insufficient documentation

## 2017-01-27 DIAGNOSIS — Y990 Civilian activity done for income or pay: Secondary | ICD-10-CM | POA: Insufficient documentation

## 2017-01-27 DIAGNOSIS — R079 Chest pain, unspecified: Secondary | ICD-10-CM

## 2017-01-27 HISTORY — DX: Essential (primary) hypertension: I10

## 2017-01-27 HISTORY — DX: Pure hypercholesterolemia, unspecified: E78.00

## 2017-01-27 MED ORDER — IBUPROFEN 600 MG PO TABS: 600.0000 mg | ORAL_TABLET | Freq: Four times a day (QID) | ORAL | 0 refills | Status: DC | PRN

## 2017-01-27 MED ORDER — GI COCKTAIL ~~LOC~~
30.0000 mL | Freq: Once | ORAL | Status: AC
  Administered 2017-01-27: 30 mL via ORAL
  Filled 2017-01-27: qty 30

## 2017-01-27 MED ORDER — IBUPROFEN 800 MG PO TABS
800.0000 mg | ORAL_TABLET | Freq: Once | ORAL | Status: AC
  Administered 2017-01-27: 800 mg via ORAL
  Filled 2017-01-27: qty 1

## 2017-01-27 MED ORDER — CYCLOBENZAPRINE HCL 10 MG PO TABS: 10.0000 mg | ORAL_TABLET | Freq: Two times a day (BID) | ORAL | 0 refills | Status: DC | PRN

## 2017-01-27 NOTE — Discharge Instructions (Signed)
Take Pepcid 20 mg twice a day for any acid reflux  Ibuprofen 600 mg 3 times a day as needed for pain  Flexeril 10 mg by mouth twice daily as needed for muscle spasm  Ice or heat to help with some of the muscle tension  See your doctor as needed.  You should follow up with the family doctor listed above if she does not have a family doctor to arrange an outpatient mammogram study to be performed within the next 1-2 weeks.  Emergency department for severe or worsening symptoms
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
## 2017-07-23 ENCOUNTER — Other Ambulatory Visit: Payer: Self-pay

## 2017-07-23 ENCOUNTER — Emergency Department (HOSPITAL_COMMUNITY): Admission: EM | Admit: 2017-07-23 | Discharge: 2017-07-23 | Discharge status: Absent without leave | Payer: Self-pay

## 2017-07-23 ENCOUNTER — Encounter (HOSPITAL_COMMUNITY): Payer: Self-pay | Admitting: Emergency Medicine

## 2017-07-23 DIAGNOSIS — I10 Essential (primary) hypertension: Secondary | ICD-10-CM | POA: Insufficient documentation

## 2017-07-23 DIAGNOSIS — R300 Dysuria: Secondary | ICD-10-CM | POA: Insufficient documentation

## 2017-07-23 DIAGNOSIS — R1030 Lower abdominal pain, unspecified: Secondary | ICD-10-CM | POA: Insufficient documentation

## 2017-07-23 DIAGNOSIS — F17210 Nicotine dependence, cigarettes, uncomplicated: Secondary | ICD-10-CM | POA: Insufficient documentation

## 2017-07-23 LAB — CBC WITH DIFFERENTIAL/PLATELET
BASOS PCT: 0 %
Basophils Absolute: 0 10*3/uL (ref 0.0–0.1)
EOS ABS: 0 10*3/uL (ref 0.0–0.7)
EOS PCT: 0 %
HCT: 35.8 % — ABNORMAL LOW (ref 36.0–46.0)
HEMOGLOBIN: 12.2 g/dL (ref 12.0–15.0)
Lymphocytes Relative: 6 %
Lymphs Abs: 1 10*3/uL (ref 0.7–4.0)
MCH: 25.6 pg — AB (ref 26.0–34.0)
MCHC: 34.1 g/dL (ref 30.0–36.0)
MCV: 75.2 fL — ABNORMAL LOW (ref 78.0–100.0)
MONOS PCT: 6 %
Monocytes Absolute: 1 10*3/uL (ref 0.1–1.0)
NEUTROS PCT: 88 %
Neutro Abs: 15.2 10*3/uL — ABNORMAL HIGH (ref 1.7–7.7)
PLATELETS: 237 10*3/uL (ref 150–400)
RBC: 4.76 MIL/uL (ref 3.87–5.11)
RDW: 14.6 % (ref 11.5–15.5)
WBC: 17.2 10*3/uL — ABNORMAL HIGH (ref 4.0–10.5)

## 2017-07-23 LAB — BASIC METABOLIC PANEL
Anion gap: 8 (ref 5–15)
BUN: 6 mg/dL (ref 6–20)
CALCIUM: 8.8 mg/dL — AB (ref 8.9–10.3)
CHLORIDE: 105 mmol/L (ref 101–111)
CO2: 22 mmol/L (ref 22–32)
CREATININE: 1.01 mg/dL — AB (ref 0.44–1.00)
Glucose, Bld: 170 mg/dL — ABNORMAL HIGH (ref 65–99)
Potassium: 3.4 mmol/L — ABNORMAL LOW (ref 3.5–5.1)
SODIUM: 135 mmol/L (ref 135–145)

## 2017-07-23 LAB — I-STAT BETA HCG BLOOD, ED (MC, WL, AP ONLY)

## 2017-07-23 LAB — I-STAT CG4 LACTIC ACID, ED: LACTIC ACID, VENOUS: 1.56 mmol/L (ref 0.5–1.9)

## 2017-07-23 NOTE — ED Triage Notes (Signed)
Pt arriving from home with complaint of chest and back pain x4 days. Pt has taken Tylenol at home (yesterday). Pt has not taken any medications at home today for symptoms. Pt denies cough and N/V but reports being dizzy at times. Pt also complaining of dysuria x2 days.

## 2017-07-24 ENCOUNTER — Emergency Department (HOSPITAL_COMMUNITY): Payer: Self-pay

## 2017-07-24 ENCOUNTER — Emergency Department (HOSPITAL_COMMUNITY)
Admission: EM | Admit: 2017-07-24 | Discharge: 2017-07-24 | Disposition: A | Discharge status: Home / Self Care | Payer: Self-pay | Attending: Emergency Medicine | Admitting: Emergency Medicine

## 2017-07-24 DIAGNOSIS — R1030 Lower abdominal pain, unspecified: Secondary | ICD-10-CM

## 2017-07-24 DIAGNOSIS — R300 Dysuria: Secondary | ICD-10-CM

## 2017-07-24 LAB — URINALYSIS, ROUTINE W REFLEX MICROSCOPIC
Bilirubin Urine: NEGATIVE
Glucose, UA: NEGATIVE mg/dL
Hgb urine dipstick: NEGATIVE
Ketones, ur: NEGATIVE mg/dL
Nitrite: NEGATIVE
PROTEIN: NEGATIVE mg/dL
Specific Gravity, Urine: 1.008 (ref 1.005–1.030)
pH: 6 (ref 5.0–8.0)

## 2017-07-24 LAB — WET PREP, GENITAL
Clue Cells Wet Prep HPF POC: NONE SEEN
Sperm: NONE SEEN
TRICH WET PREP: NONE SEEN
YEAST WET PREP: NONE SEEN

## 2017-07-24 LAB — HIV ANTIBODY (ROUTINE TESTING W REFLEX): HIV SCREEN 4TH GENERATION: NONREACTIVE

## 2017-07-24 MED ORDER — TRAMADOL HCL 50 MG PO TABS: 50.0000 mg | ORAL_TABLET | Freq: Four times a day (QID) | ORAL | 0 refills | Status: DC | PRN

## 2017-07-24 MED ORDER — TRAMADOL HCL 50 MG PO TABS
50.0000 mg | ORAL_TABLET | Freq: Once | ORAL | Status: DC
  Filled 2017-07-24: qty 1

## 2017-07-24 MED ORDER — MORPHINE SULFATE (PF) 4 MG/ML IV SOLN
4.0000 mg | Freq: Once | INTRAVENOUS | Status: AC
  Administered 2017-07-24: 4 mg via INTRAVENOUS
  Filled 2017-07-24: qty 1

## 2017-07-24 MED ORDER — CEFTRIAXONE SODIUM 250 MG IJ SOLR
250.0000 mg | Freq: Once | INTRAMUSCULAR | Status: AC
  Administered 2017-07-24: 250 mg via INTRAMUSCULAR
  Filled 2017-07-24: qty 250

## 2017-07-24 MED ORDER — LIDOCAINE HCL 1 % IJ SOLN
INTRAMUSCULAR | Status: AC
  Administered 2017-07-24: 1.9 mL
  Filled 2017-07-24: qty 20

## 2017-07-24 MED ORDER — AZITHROMYCIN 250 MG PO TABS
1000.0000 mg | ORAL_TABLET | Freq: Once | ORAL | Status: AC
  Administered 2017-07-24: 1000 mg via ORAL
  Filled 2017-07-24: qty 4

## 2017-07-24 MED ORDER — ONDANSETRON 8 MG PO TBDP
8.0000 mg | ORAL_TABLET | Freq: Once | ORAL | Status: AC
  Administered 2017-07-24: 8 mg via ORAL
  Filled 2017-07-24: qty 1

## 2017-07-24 MED ORDER — IOPAMIDOL (ISOVUE-300) INJECTION 61%
INTRAVENOUS | Status: AC
  Filled 2017-07-24: qty 100

## 2017-07-24 MED ORDER — CEPHALEXIN 500 MG PO CAPS: 1000.0000 mg | ORAL_CAPSULE | Freq: Two times a day (BID) | ORAL | 0 refills | Status: DC

## 2017-07-24 MED ORDER — IOPAMIDOL (ISOVUE-300) INJECTION 61%
100.0000 mL | Freq: Once | INTRAVENOUS | Status: AC | PRN
  Administered 2017-07-24: 100 mL via INTRAVENOUS

## 2017-07-24 MED ORDER — SODIUM CHLORIDE 0.9 % IV BOLUS (SEPSIS)
1000.0000 mL | Freq: Once | INTRAVENOUS | Status: AC
  Administered 2017-07-24: 1000 mL via INTRAVENOUS

## 2017-07-24 MED ORDER — PROMETHAZINE HCL 25 MG PO TABS: 25.0000 mg | ORAL_TABLET | Freq: Four times a day (QID) | ORAL | 0 refills | Status: DC | PRN

## 2017-07-24 MED ORDER — ACETAMINOPHEN 325 MG PO TABS
650.0000 mg | ORAL_TABLET | Freq: Once | ORAL | Status: AC
  Administered 2017-07-24: 650 mg via ORAL
  Filled 2017-07-24: qty 2

## 2017-07-24 NOTE — Discharge Instructions (Signed)
Return here as needed. Increase your fluid intake. You have a urinary tract infection.

## 2017-07-24 NOTE — ED Notes (Signed)
Made Thayer Ohmhris PA aware of fever. New orders to be placed

## 2017-07-24 NOTE — ED Notes (Signed)
Patient transported to CT 

## 2017-07-24 NOTE — ED Notes (Signed)
Per Christiane HaJonathan RN, was unable to get an IV on patient.  Made Tim Charge Rn aware, to see if he would be able to start IV.

## 2017-07-24 NOTE — ED Provider Notes (Signed)
[facility]-EMERGENCY DEPT Provider Note   CSN: [id number] Arrival date & time: 07/23/17  2148     History   Chief Complaint Chief Complaint  Patient presents with  . Chest Pain  . Back Pain  . Dysuria    HPI Cathy Reid is a 32 y.o. female.  Patient presents with complaint of generalized muscle aching, including back, chest, hips and thighs. She reports difficulty urinating as she feels it is difficult to empty her bladder and urinating is painful. Back pain is bilateral and is not localized to flank. No known fever, but she reports chills. No nausea, vomiting or diarrhea. She is unsure if she is having abnormal vaginal discharge and is concerned she might have an STD.    The history is provided by the patient. No language interpreter was used.    Past Medical History:  Diagnosis Date  . Hypercholesteremia   . Hypertension     There are no active problems to display for this patient.   History reviewed. No pertinent surgical history.  OB History    No data available       Home Medications    Prior to Admission medications   Medication Sig Start Date End Date Taking? Authorizing Provider  cyclobenzaprine (FLEXERIL) 10 MG tablet Take 1 tablet (10 mg total) by mouth 2 (two) times daily as needed for muscle spasms. Patient not taking: Reported on 07/24/2017 01/27/17   Eber Hong, MD  ibuprofen (ADVIL,MOTRIN) 600 MG tablet Take 1 tablet (600 mg total) by mouth every 6 (six) hours as needed. Patient not taking: Reported on 07/24/2017 01/27/17   Eber Hong, MD    Family History No family history on file.  Social History Social History   Tobacco Use  . Smoking status: Current Every Day Smoker    Types: Cigarettes  . Smokeless tobacco: Never Used  Substance Use Topics  . Alcohol use: No  . Drug use: No     Allergies   Ciprofloxacin; Naproxen; and Vicodin [hydrocodone-acetaminophen]   Review of Systems Review of Systems    Constitutional: Positive for chills. Negative for fever.  HENT: Negative.   Respiratory: Negative.  Negative for cough.   Cardiovascular: Negative.  Negative for chest pain.  Gastrointestinal: Negative.  Negative for abdominal pain, nausea and vomiting.  Genitourinary: Positive for difficulty urinating and dysuria. Negative for flank pain and frequency.  Musculoskeletal: Positive for myalgias.  Skin: Negative.   Neurological: Negative.      Physical Exam Updated Vital Signs BP (!) 170/93 (BP Location: Right Arm)   Pulse 88   Temp 98.2 F (36.8 C) (Oral)   Resp 14   Ht 5\' 3"  (1.6 m)   Wt 90.7 kg (200 lb)   LMP 05/26/2017   SpO2 99%   BMI 35.43 kg/m   Physical Exam  Constitutional: She appears well-developed and well-nourished.  HENT:  Head: Normocephalic.  Neck: Normal range of motion. Neck supple.  Cardiovascular: Normal rate and regular rhythm.  Pulmonary/Chest: Effort normal and breath sounds normal. She has no wheezes. She has no rales.  Abdominal: Soft. Bowel sounds are normal. There is no tenderness. There is no rebound and no guarding.  Genitourinary:  Genitourinary Comments: There is a small amount of green discharge from nontender cervix. No bleeding. No adnexal mass or tenderness. No CVA tenderness.   Musculoskeletal: Normal range of motion.  Neurological: She is alert. No cranial nerve deficit.  Skin: Skin is warm and dry. No rash noted.  Psychiatric: She has a normal mood and affect.     ED Treatments / Results  Labs (all labs ordered are listed, but only abnormal results are displayed) Labs Reviewed  CBC WITH DIFFERENTIAL/PLATELET - Abnormal; Notable for the following components:      Result Value   WBC 17.2 (*)    HCT 35.8 (*)    MCV 75.2 (*)    MCH 25.6 (*)    Neutro Abs 15.2 (*)    All other components within normal limits  BASIC METABOLIC PANEL - Abnormal; Notable for the following components:   Potassium 3.4 (*)    Glucose, Bld 170 (*)     Creatinine, Ser 1.01 (*)    Calcium 8.8 (*)    All other components within normal limits  WET PREP, GENITAL  URINALYSIS, ROUTINE W REFLEX MICROSCOPIC  RPR  HIV ANTIBODY (ROUTINE TESTING)  I-STAT CG4 LACTIC ACID, ED  I-STAT BETA HCG BLOOD, ED ([facility], [facility], [facility] ONLY)  GC/CHLAMYDIA PROBE AMP ([place]) NOT AT [facility]    EKG  EKG Interpretation None       Radiology Dg Chest 2 View  Result Date: 07/24/2017 CLINICAL DATA:  32 y/o F; mid chest pain, upper abdominal pain, and fever for 24 hours. EXAM: CHEST  2 VIEW COMPARISON:  01/27/2017 chest radiograph FINDINGS: Stable heart size and mediastinal contours are within normal limits. Both lungs are clear. The visualized skeletal structures are unremarkable. IMPRESSION: No acute pulmonary process identified. Electronically Signed   By: Mitzi HansenLance  Furusawa-Stratton M.D.   On: 07/24/2017 06:10    Procedures Procedures (including critical care time)  Medications Ordered in ED Medications - No data to display   Initial Impression / Assessment and Plan / ED Course  I have reviewed the triage vital signs and the nursing notes.  Pertinent labs & imaging results that were available during my care of the patient were reviewed by me and considered in my medical decision making (see chart for details).     Patient presents with urinary symptoms and muscle aches. She seems to be concerned with whether or not she has an STD.   She is found to have an elevated WBC count without fever or URI symptoms. UA pending. Suspect UTI. Will also need to cover for STD based on cervical discharge.   Patient care signed out to [facility] Lawyer, PA-C, pending review of UA and for further disposition based on findings.   Final Clinical Impressions(s) / ED Diagnoses   Final diagnoses:  None   1. Dysuria   ED Discharge Orders    None       Elpidio AnisUpstill, Tino Ronan, PA-C 07/24/17 2226    Ward, Layla MawKristen N, DO 07/25/17 [phone]

## 2017-07-24 NOTE — ED Provider Notes (Signed)
Patient developed a fever here in the emergency department will treat her at home for polynephritis.  The patient has not been vomiting here in the emergency department told to return here as needed.  Based off of her CT scan findings the patient.;  Patient is advised to return here as needed.  I told her that her condition could worsen and would need follow-up.  Have advised her to increase her fluid intake.   Charlestine NightLawyer, Silver Parkey, PA-C 07/24/17 1305    Gerhard MunchLockwood, Robert, MD 07/24/17 1525

## 2017-07-25 LAB — GC/CHLAMYDIA PROBE AMP (~~LOC~~) NOT AT ARMC
Chlamydia: NEGATIVE
Neisseria Gonorrhea: NEGATIVE

## 2017-07-30 LAB — RPR: RPR: NONREACTIVE

## 2018-02-19 IMAGING — CT CT ABD-PELV W/ CM
2 of 4 series · 17 of 46 positions shown, 19 images · IV contrast (ISOVUE)
Comparison: Chest radiograph 07/24/2017

CLINICAL DATA: Back pain 4 days.  Nausea.  Dysuria.

EXAM:
CT ABDOMEN AND PELVIS WITH CONTRAST
TECHNIQUE: Multidetector CT imaging of the abdomen and pelvis was performed
using the standard protocol following bolus administration of
intravenous contrast.
CONTRAST:  100mL S2X9XM-MYY IOPAMIDOL (S2X9XM-MYY) INJECTION 61%

[Series 2: axial st · axial · 0.79mm/px · z∈[-618,-198]mm · 14 of 96 slices shown, 16 images]
[im 6/96  soft-tissue]
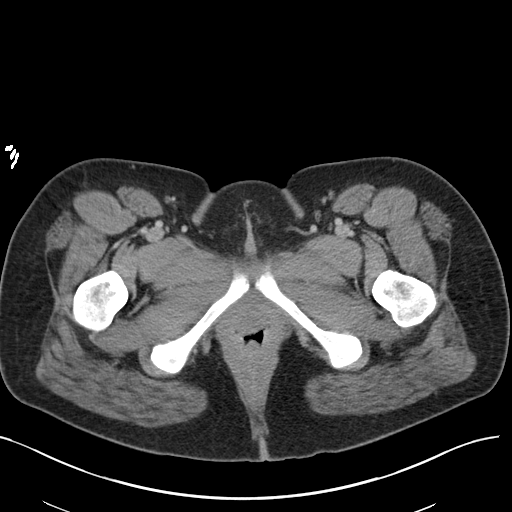
[im 6/96  bone]
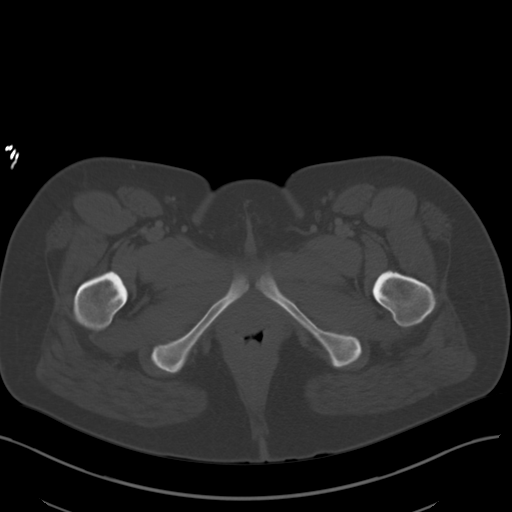
[im 11/96  soft-tissue]
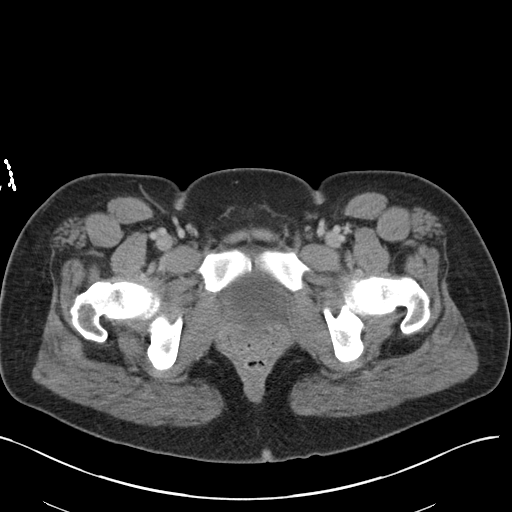
[im 22/96  soft-tissue]
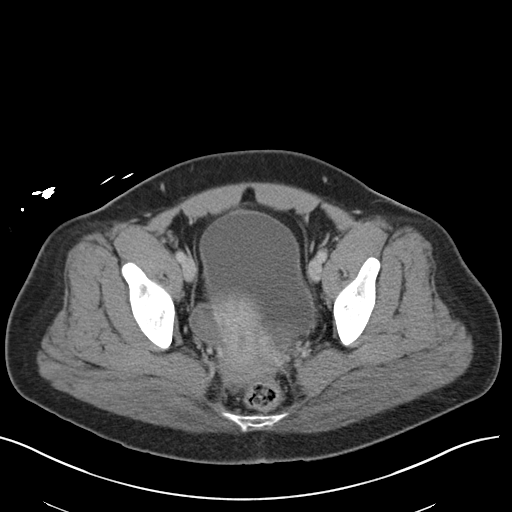
[im 27/96  soft-tissue]
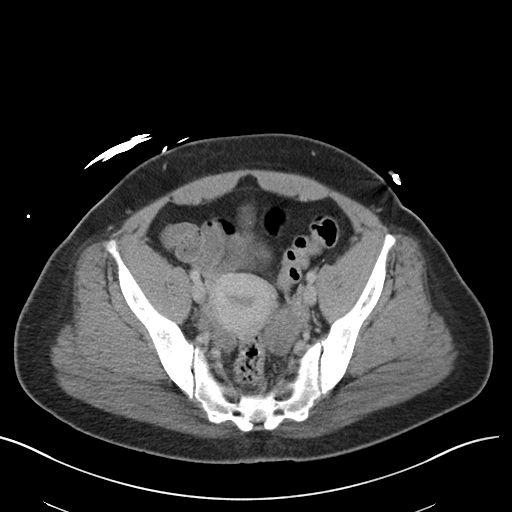
[im 32/96  soft-tissue]
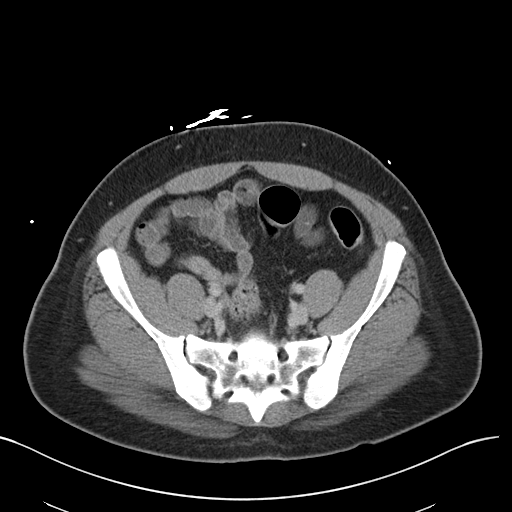
[im 37/96  soft-tissue]
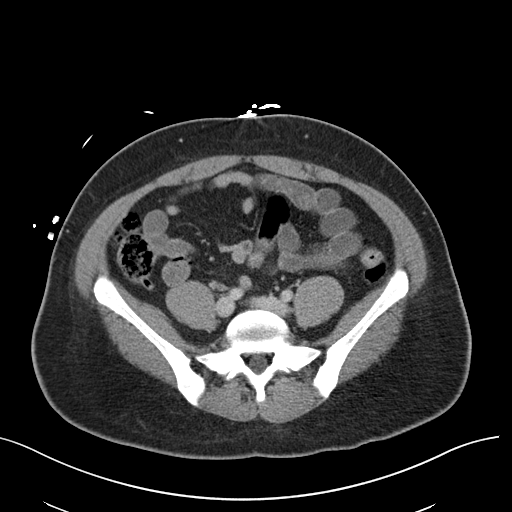
[im 43/96  soft-tissue]
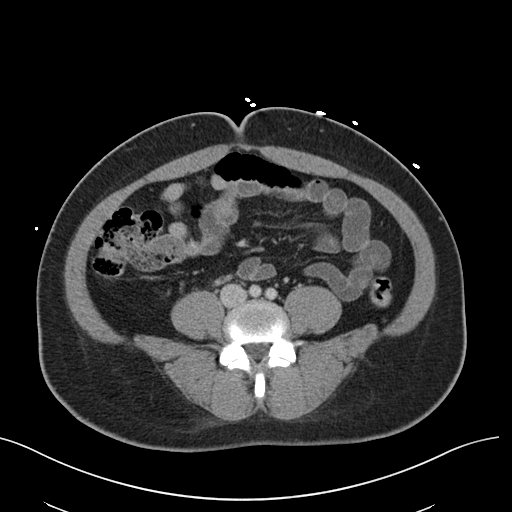
[im 53/96  soft-tissue]
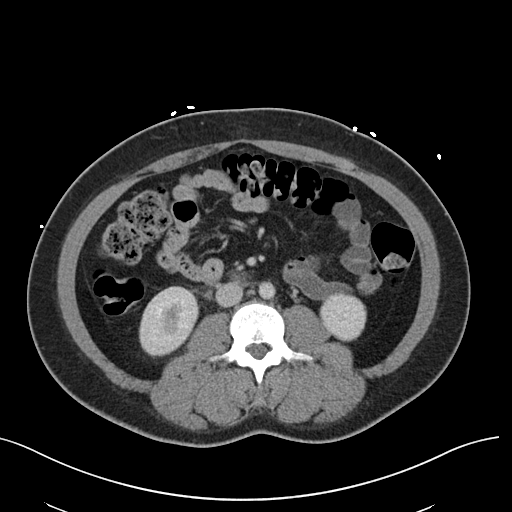
[im 59/96  soft-tissue]
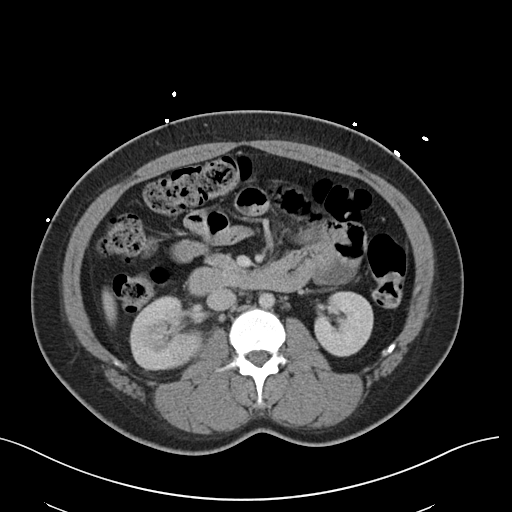
[im 59/96  bone]
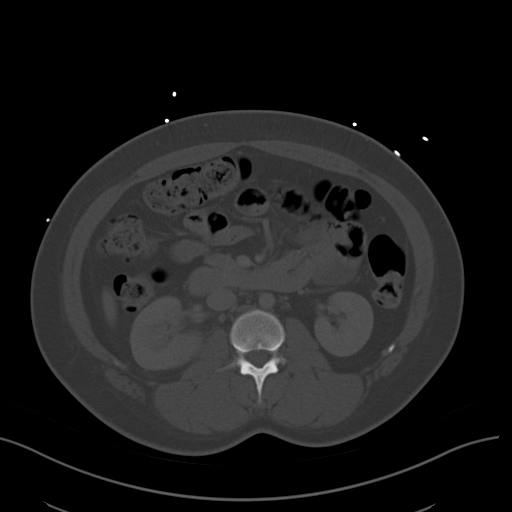
[im 64/96  soft-tissue]
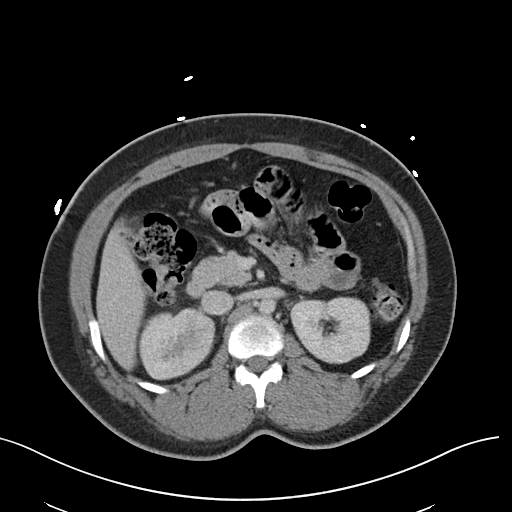
[im 69/96  soft-tissue]
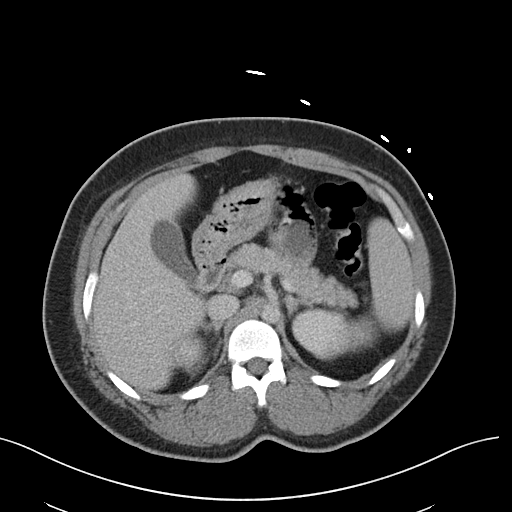
[im 74/96  soft-tissue]
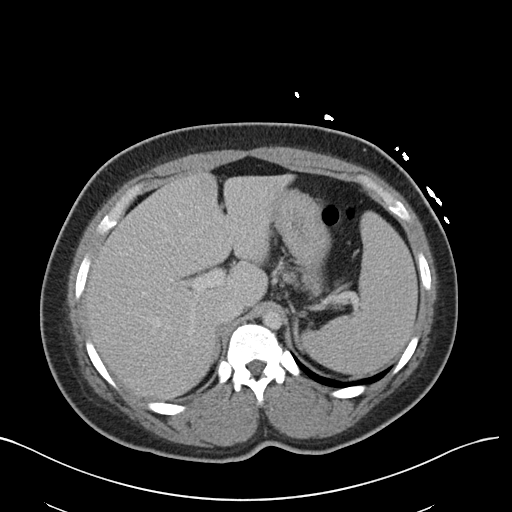
[im 85/96  soft-tissue]
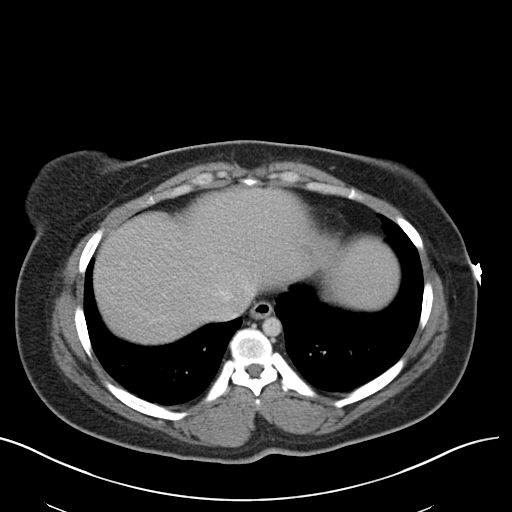
[im 90/96  soft-tissue]
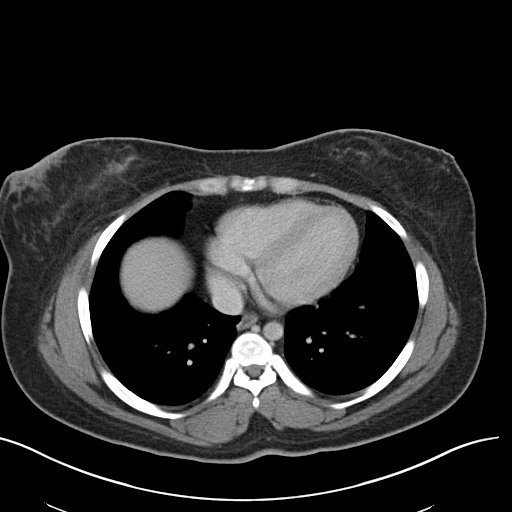

[Series 4: coronal st · coronal · 0.89mm/px · 3 of 79 slices shown]
[im 27/79  soft-tissue]
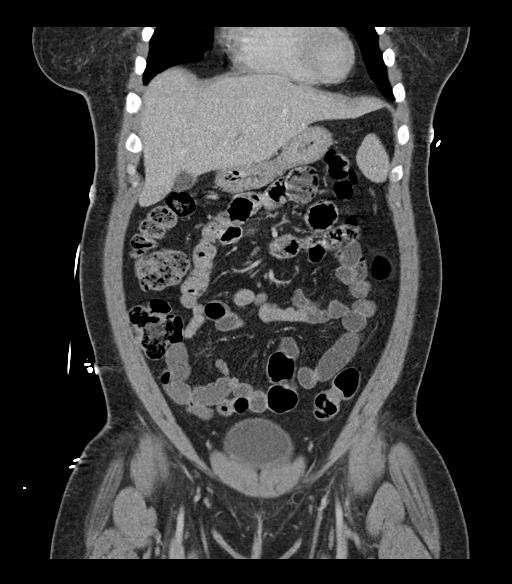
[im 35/79  soft-tissue]
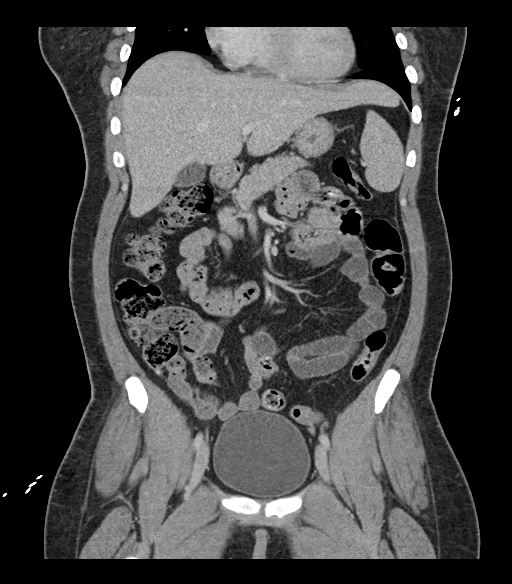
[im 44/79  soft-tissue]
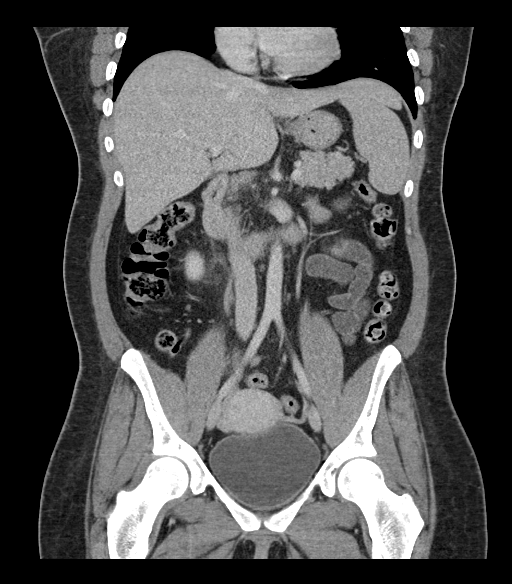

[17 of 46 positions shown; findings below may reference images not displayed]

FINDINGS: Lower chest: Lung bases are clear.

Hepatobiliary: No focal hepatic lesion. No biliary duct dilatation.
Gallbladder is normal. Common bile duct is normal.

Pancreas: Pancreas is normal. No ductal dilatation. No pancreatic
inflammation.

Spleen: Normal spleen

Adrenals/urinary tract: Adrenal glands normal. There is subtle
hypoattenuation within the RIGHT renal cortex intermittently
involving the entirety the kidney. There is mild perinephric
stranding and proximal periureteral stranding on the RIGHT. No
nephrolithiasis or obstructive uropathy. No ureterolithiasis.

Bladder normal

Stomach/Bowel: Stomach, small bowel, appendix, and cecum are normal.
The colon and rectosigmoid colon are normal.

Vascular/Lymphatic: Abdominal aorta is normal caliber. There is no
retroperitoneal or periportal lymphadenopathy. No pelvic
lymphadenopathy.

Reproductive: Uterus and ovaries normal

Other: No free fluid.

Musculoskeletal: No aggressive osseous lesion.
IMPRESSION: 1. Subtle segmental cortical hypoperfusion in the RIGHT kidney most
consistent with ACUTE PYELONEPHRITIS.
2. No ureterolithiasis or obstructive uropathy.

## 2020-10-23 ENCOUNTER — Inpatient Hospital Stay
Admit: 2020-10-23 | Discharge: 2020-10-23 | Disposition: A | Discharge status: Home / Self Care | Payer: BLUE CROSS/BLUE SHIELD | Attending: Emergency Medicine

## 2020-10-23 DIAGNOSIS — S0502XA Injury of conjunctiva and corneal abrasion without foreign body, left eye, initial encounter: Secondary | ICD-10-CM

## 2020-10-23 MED ORDER — FLUORESCEIN 1 MG EYE STRIPS
1 mg | OPHTHALMIC | Status: AC
Start: 2020-10-23 — End: 2020-10-23
  Administered 2020-10-23: 11:00:00 via OPHTHALMIC

## 2020-10-23 MED ORDER — MOXIFLOXACIN 0.5 % EYE DROPS
0.5 % | Freq: Three times a day (TID) | OPHTHALMIC | 0 refills | Status: AC
Start: 2020-10-23 — End: ?

## 2020-10-23 MED ORDER — HYDROCODONE-ACETAMINOPHEN 7.5 MG-325 MG TAB
ORAL | Status: AC
Start: 2020-10-23 — End: 2020-10-23
  Administered 2020-10-23: 12:00:00 via ORAL

## 2020-10-23 MED ORDER — HYDROCODONE-ACETAMINOPHEN 7.5 MG-325 MG TAB
ORAL_TABLET | Freq: Two times a day (BID) | ORAL | 0 refills | Status: AC
Start: 2020-10-23 — End: 2020-11-22

## 2020-10-23 MED ORDER — TETRACAINE HCL (PF) 0.5 % EYE DROPS
0.5 % | OPHTHALMIC | Status: AC
Start: 2020-10-23 — End: 2020-10-23
  Administered 2020-10-23: 11:00:00 via OPHTHALMIC

## 2020-10-23 MED FILL — BIOGLO 1 MG EYE STRIPS: 1 mg | OPHTHALMIC | Qty: 1

## 2020-10-23 MED FILL — HYDROCODONE-ACETAMINOPHEN 7.5 MG-325 MG TAB: ORAL | Qty: 1

## 2020-10-23 MED FILL — TETRACAINE HCL (PF) 0.5 % EYE DROPS: 0.5 % | OPHTHALMIC | Qty: 4

## 2020-10-23 NOTE — ED Notes (Signed)
Pt states she feels like there is something scratching her left. She tried wearing her contacts today, and now has irritation after taking out both contacts. Pt states she may have scratched eye with finger nail. Friend states contacts were in tact in case.

## 2020-10-23 NOTE — ED Provider Notes (Signed)
ED Provider Notes by Lauris Poag, MD at 10/23/20 8295                Author: Lauris Poag, MD  Service: Emergency Medicine  Author Type: Physician       Filed: 10/23/20 0741  Date of Service: 10/23/20 0631  Status: Signed          Editor: Lauris Poag, MD (Physician)               35 year old female painful left eye foreign body sensation.  Patient was taking her contacts out last night had  difficult time getting the left contact out.  Through the night it got worse and she felt like she had a foreign body sensation.  Her vision was blurry so she came to get checked.      The history is provided by the patient.    Eye Pain    This is a new problem. The  current episode started 6 to 12 hours ago. The problem  occurs constantly. The problem has not changed since  onset.The left eye is affected. The injury mechanism was contact lenses. The pain is at a severity of 10/10. The pain is severe. There  is no history of trauma to the eye. Associated symptoms include blurred vision, photophobia, negative and pain.           History reviewed. No pertinent past medical history.      History reviewed. No pertinent surgical history.        History reviewed. No pertinent family history.        Social History          Socioeconomic History         ?  Marital status:  SINGLE              Spouse name:  Not on file         ?  Number of children:  Not on file     ?  Years of education:  Not on file     ?  Highest education level:  Not on file       Occupational History        ?  Not on file       Tobacco Use         ?  Smoking status:  Current Every Day Smoker              Packs/day:  0.25         ?  Smokeless tobacco:  Never Used       Substance and Sexual Activity         ?  Alcohol use:  Not Currently     ?  Drug use:  Not Currently     ?  Sexual activity:  Not on file        Other Topics  Concern        ?  Not on file       Social History Narrative        ?  Not on file          Social Determinants of Health           Financial Resource Strain:         ?  Difficulty of Paying Living Expenses: Not on file       Food Insecurity:         ?  Worried About Running Out of Food in  the Last Year: Not on file     ?  Ran Out of Food in the Last Year: Not on file       Transportation Needs:         ?  Lack of Transportation (Medical): Not on file     ?  Lack of Transportation (Non-Medical): Not on file       Physical Activity:         ?  Days of Exercise per Week: Not on file     ?  Minutes of Exercise per Session: Not on file       Stress:         ?  Feeling of Stress : Not on file       Social Connections:         ?  Frequency of Communication with Friends and Family: Not on file     ?  Frequency of Social Gatherings with Friends and Family: Not on file     ?  Attends Religious Services: Not on file     ?  Active Member of Clubs or Organizations: Not on file     ?  Attends Banker Meetings: Not on file     ?  Marital Status: Not on file       Intimate Partner Violence:         ?  Fear of Current or Ex-Partner: Not on file     ?  Emotionally Abused: Not on file     ?  Physically Abused: Not on file     ?  Sexually Abused: Not on file       Housing Stability:         ?  Unable to Pay for Housing in the Last Year: Not on file     ?  Number of Places Lived in the Last Year: Not on file        ?  Unstable Housing in the Last Year: Not on file              ALLERGIES: Patient has no known allergies.      Review of Systems    Constitutional: Negative.  Negative for activity change.    HENT: Negative.     Eyes: Positive for blurred vision, photophobia  and pain.    Respiratory: Negative.     Cardiovascular: Negative.     Gastrointestinal: Negative.     Genitourinary: Negative.     Musculoskeletal: Negative.     Skin: Negative.     Neurological: Negative.     Psychiatric/Behavioral: Negative.     All other systems reviewed and are negative.           Vitals:          10/23/20 0545        BP:  (!) 152/77     Pulse:  (!) 103      Resp:  16     Temp:  98.1 ??F (36.7 ??C)     SpO2:  100%     Weight:  95.3 kg (210 lb)        Height:  5\' 7"  (1.702 m)                Physical Exam   Vitals and nursing note reviewed.   Constitutional:        General: She is not in acute distress.     Appearance: She is well-developed.  HENT:       Head: Normocephalic and atraumatic.      Right Ear: External ear normal.      Left Ear: External ear normal.      Nose: Nose normal.   Eyes:       General: Lids are normal. Lids are everted, no foreign bodies appreciated. No scleral icterus.        Right eye: Discharge present.          Left eye: No discharge.      Extraocular Movements: Extraocular movements intact.      Right eye: Normal extraocular motion and no nystagmus.      Left eye: Normal extraocular motion and no nystagmus.      Conjunctiva/sclera:       Right eye: Right conjunctiva is injected.      Pupils: Pupils are equal, round, and reactive to light.      Right eye: Pupil is  round and reactive. Corneal abrasion and fluorescein uptake  present.      Left eye: Pupil is round and reactive.       Cardiovascular:       Rate and Rhythm: Regular rhythm.   Pulmonary :       Effort: Pulmonary effort is normal. No respiratory distress.      Breath sounds: No stridor. No wheezing or rales.   Abdominal:      General:  There is no distension.      Palpations: Abdomen is soft.      Tenderness: There is no abdominal tenderness.     Musculoskeletal:          General: Normal range of motion.      Cervical back: Normal range of motion.    Skin:      General: Skin is warm and dry.      Findings: No rash.    Neurological:       Mental Status: She is alert and oriented to person, place, and time.      Motor: No abnormal muscle tone.      Coordination: Coordination normal.   Psychiatric:         Behavior: Behavior normal.              MDM   Number of Diagnoses or Management Options   Diagnosis management comments: Patient has corneal abrasion right over the pupil.  3 sweeps  under each lid no foreign body      Plan: Visual acuity contact ophthalmology.  Expect have discharge and follow-up Monday with ophthalmology Will place on pain medicine and antibiotic eyedrops.      Risk of Complications, Morbidity, and/or Mortality   Presenting problems: moderate  Diagnostic procedures: moderate  Management options: moderate               Procedures

## 2020-10-23 NOTE — ED Notes (Signed)

## 2021-12-22 ENCOUNTER — Encounter (HOSPITAL_BASED_OUTPATIENT_CLINIC_OR_DEPARTMENT_OTHER): Payer: Self-pay | Admitting: Emergency Medicine

## 2021-12-22 ENCOUNTER — Other Ambulatory Visit: Payer: Self-pay

## 2021-12-22 ENCOUNTER — Emergency Department (HOSPITAL_BASED_OUTPATIENT_CLINIC_OR_DEPARTMENT_OTHER)
Admission: EM | Admit: 2021-12-22 | Discharge: 2021-12-22 | Disposition: A | Discharge status: Home / Self Care | Payer: No Typology Code available for payment source | Attending: Emergency Medicine | Admitting: Emergency Medicine

## 2021-12-22 DIAGNOSIS — K649 Unspecified hemorrhoids: Secondary | ICD-10-CM | POA: Diagnosis not present

## 2021-12-22 DIAGNOSIS — N941 Unspecified dyspareunia: Secondary | ICD-10-CM | POA: Insufficient documentation

## 2021-12-22 DIAGNOSIS — R102 Pelvic and perineal pain: Secondary | ICD-10-CM | POA: Diagnosis present

## 2021-12-22 LAB — WET PREP, GENITAL
Clue Cells Wet Prep HPF POC: NONE SEEN
Sperm: NONE SEEN
Trich, Wet Prep: NONE SEEN
WBC, Wet Prep HPF POC: <10 (ref <10)
Yeast Wet Prep HPF POC: NONE SEEN

## 2021-12-22 LAB — URINALYSIS, ROUTINE W REFLEX MICROSCOPIC
Bilirubin Urine: NEGATIVE
Glucose, UA: NEGATIVE mg/dL
Hgb urine dipstick: NEGATIVE
Ketones, ur: NEGATIVE mg/dL
Leukocytes,Ua: NEGATIVE
Nitrite: NEGATIVE
Protein, ur: NEGATIVE mg/dL
Specific Gravity, Urine: 1.02 (ref 1.005–1.030)
pH: 6 (ref 5.0–8.0)

## 2021-12-22 LAB — PREGNANCY, URINE: Preg Test, Ur: NEGATIVE

## 2021-12-22 MED ORDER — HYDROCORTISONE 1 % EX CREA: TOPICAL_CREAM | CUTANEOUS | 0 refills | Status: AC

## 2021-12-22 MED ORDER — IBUPROFEN 800 MG PO TABS
800.0000 mg | ORAL_TABLET | Freq: Once | ORAL | Status: AC
  Administered 2021-12-22: 800 mg via ORAL
  Filled 2021-12-22: qty 1

## 2021-12-22 MED ORDER — ACETAMINOPHEN 500 MG PO TABS
1000.0000 mg | ORAL_TABLET | Freq: Once | ORAL | Status: AC
  Administered 2021-12-22: 1000 mg via ORAL
  Filled 2021-12-22: qty 2

## 2021-12-22 MED ORDER — LIDOCAINE HCL URETHRAL/MUCOSAL 2 % EX GEL
1.0000 | Freq: Once | CUTANEOUS | Status: AC
  Administered 2021-12-22: 1
  Filled 2021-12-22: qty 11

## 2021-12-22 NOTE — ED Triage Notes (Signed)
Pt reports pain with intercourse last night and is having pressure in pelvis.

## 2021-12-22 NOTE — ED Provider Notes (Signed)
[facility] EMERGENCY DEPARTMENT Provider Note   CSN: [id number] Arrival date & time: 12/22/21  0411     History  Chief Complaint  Patient presents with   Pelvic Pain    Cathy Reid is a 36 y.o. female.  The history is provided by the patient.  Pelvic Pain This is a recurrent problem. The current episode started 3 to 5 hours ago. The problem occurs constantly. The problem has not changed since onset.Pertinent negatives include no chest pain, no abdominal pain, no headaches and no shortness of breath. The symptoms are aggravated by intercourse (it is what causes the pain). She has tried nothing for the symptoms. The treatment provided no relief.  Pain with sexual intercourse and rectal pressure.       Home Medications Prior to Admission medications   Not on File      Allergies    Ciprofloxacin, Naproxen, and Vicodin [hydrocodone-acetaminophen]    Review of Systems   Review of Systems  Constitutional:  Negative for fever.  HENT:  Negative for facial swelling.   Eyes:  Negative for redness.  Respiratory:  Negative for shortness of breath.   Cardiovascular:  Negative for chest pain.  Gastrointestinal:  Negative for abdominal pain.  Genitourinary:  Positive for pelvic pain. Negative for dysuria, vaginal bleeding and vaginal discharge.  Neurological:  Negative for headaches.  All other systems reviewed and are negative.   Physical Exam Updated Vital Signs BP (!) 159/90   Pulse 90   Temp 97.6 F (36.4 C) (Oral)   Resp 16   Ht 5\' 2"  (1.575 m)   Wt 70.8 kg   LMP 12/05/2021   SpO2 99%   BMI 28.53 kg/m  Physical Exam Vitals and nursing note reviewed. Exam conducted with a chaperone present.  Constitutional:      General: She is not in acute distress.    Appearance: She is well-developed.  HENT:     Head: Normocephalic and atraumatic.     Nose: Nose normal.  Eyes:     Pupils: Pupils are equal, round, and reactive to light.  Cardiovascular:      Rate and Rhythm: Normal rate and regular rhythm.  Pulmonary:     Effort: Pulmonary effort is normal. No respiratory distress.     Breath sounds: Normal breath sounds.  Abdominal:     General: Bowel sounds are normal. There is no distension.     Palpations: Abdomen is soft.     Tenderness: There is no abdominal tenderness. There is no guarding or rebound.  Genitourinary:    General: Normal vulva.     Vagina: No vaginal discharge.    Musculoskeletal:        General: Normal range of motion.     Cervical back: Neck supple.  Skin:    General: Skin is warm and dry.     Capillary Refill: Capillary refill takes less than 2 seconds.     Findings: No erythema or rash.  Neurological:     General: No focal deficit present.     Mental Status: She is oriented to person, place, and time.     Deep Tendon Reflexes: Reflexes normal.  Psychiatric:        Mood and Affect: Mood normal.        Behavior: Behavior normal.     ED Results / Procedures / Treatments   Labs (all labs ordered are listed, but only abnormal results are displayed) Results for orders placed or performed during the  hospital encounter of 12/22/21  Wet prep, genital   Specimen: Urine, Clean Catch  Result Value Ref Range   Yeast Wet Prep HPF POC NONE SEEN NONE SEEN   Trich, Wet Prep NONE SEEN NONE SEEN   Clue Cells Wet Prep HPF POC NONE SEEN NONE SEEN   WBC, Wet Prep HPF POC <10 <10   Sperm NONE SEEN   Pregnancy, urine  Result Value Ref Range   Preg Test, Ur NEGATIVE NEGATIVE  Urinalysis, Routine w reflex microscopic Urine, Clean Catch  Result Value Ref Range   Color, Urine YELLOW YELLOW   APPearance CLEAR CLEAR   Specific Gravity, Urine 1.020 1.005 - 1.030   pH 6.0 5.0 - 8.0   Glucose, UA NEGATIVE NEGATIVE mg/dL   Hgb urine dipstick NEGATIVE NEGATIVE   Bilirubin Urine NEGATIVE NEGATIVE   Ketones, ur NEGATIVE NEGATIVE mg/dL   Protein, ur NEGATIVE NEGATIVE mg/dL   Nitrite NEGATIVE NEGATIVE   Leukocytes,Ua NEGATIVE  NEGATIVE   No results found.  None  Radiology No results found.  Procedures Procedures    Medications Ordered in ED Medications  acetaminophen (TYLENOL) tablet 1,000 mg (has no administration in time range)  ibuprofen (ADVIL) tablet 800 mg (has no administration in time range)  lidocaine (XYLOCAINE) 2 % jelly 1 Application (has no administration in time range)    ED Course/ Medical Decision Making/ A&P                           Medical Decision Making Patient with pain with sexual intercourse and now rectal pressure   Problems Addressed: Dyspareunia in female:    Details: no infection, will need to follow up with her gynecologist to discuss treatment Hemorrhoids, unspecified hemorrhoid type:    Details: will start proctofoam and sitz baths  Amount and/or Complexity of Data Reviewed External Data Reviewed: notes.    Details: previous notes reviewed Labs: ordered.    Details: negative pregnancy test and urinalysis.  No UTI.  Negative wet prep  Risk OTC drugs. Prescription drug management. Risk Details: No infectious cause of patient's symptoms.  Dyspareunia that is ongoing will need further evaluation by the patient's GYN.  Will start treatment for hemorrhoids.  Patient instructed to follow up as an outpatient     Final Clinical Impression(s) / ED Diagnoses Return for intractable cough, coughing up blood, fevers > 100.4 unrelieved by medication, shortness of breath, intractable vomiting, chest pain, shortness of breath, weakness, numbness, changes in speech, facial asymmetry, abdominal pain, passing out, Inability to tolerate liquids or food, cough, altered mental status or any concerns. No signs of systemic illness or infection. The patient is nontoxic-appearing on exam and vital signs are within normal limits.  I have reviewed the triage vital signs and the nursing notes. Pertinent labs & imaging results that were available during my care of the patient were reviewed by  me and considered in my medical decision making (see chart for details). After history, exam, and medical workup I feel the patient has been appropriately medically screened and is safe for discharge home. Pertinent diagnoses were discussed with the patient. Patient was given return precautions.  Rx / DC Orders ED Discharge Orders     None         Mackenna Kamer, MD 12/22/21 7564

## 2021-12-25 LAB — GC/CHLAMYDIA PROBE AMP (~~LOC~~) NOT AT ARMC
Chlamydia: NEGATIVE
Comment: NEGATIVE
Comment: NORMAL
Neisseria Gonorrhea: NEGATIVE

## 2021-12-30 ENCOUNTER — Emergency Department (HOSPITAL_COMMUNITY)
Admission: EM | Admit: 2021-12-30 | Discharge: 2021-12-30 | Discharge status: Against Medical Advice | Payer: No Typology Code available for payment source | Attending: Emergency Medicine | Admitting: Emergency Medicine

## 2021-12-30 ENCOUNTER — Emergency Department (HOSPITAL_COMMUNITY): Payer: No Typology Code available for payment source

## 2021-12-30 ENCOUNTER — Encounter (HOSPITAL_COMMUNITY): Payer: Self-pay

## 2021-12-30 ENCOUNTER — Other Ambulatory Visit: Payer: Self-pay

## 2021-12-30 DIAGNOSIS — R4182 Altered mental status, unspecified: Secondary | ICD-10-CM | POA: Insufficient documentation

## 2021-12-30 DIAGNOSIS — Z5321 Procedure and treatment not carried out due to patient leaving prior to being seen by health care provider: Secondary | ICD-10-CM | POA: Insufficient documentation

## 2021-12-30 LAB — URINALYSIS, ROUTINE W REFLEX MICROSCOPIC
Bilirubin Urine: NEGATIVE
Glucose, UA: NEGATIVE mg/dL
Ketones, ur: NEGATIVE mg/dL
Nitrite: NEGATIVE
Protein, ur: 30 mg/dL — AB
RBC / HPF: >50 RBC/hpf — ABNORMAL HIGH (ref 0–5)
Specific Gravity, Urine: 1.012 (ref 1.005–1.030)
pH: 6 (ref 5.0–8.0)

## 2021-12-30 LAB — RAPID URINE DRUG SCREEN, HOSP PERFORMED
Amphetamines: NOT DETECTED
Barbiturates: NOT DETECTED
Benzodiazepines: NOT DETECTED
Cocaine: NOT DETECTED
Opiates: POSITIVE — AB
Tetrahydrocannabinol: NOT DETECTED

## 2021-12-30 LAB — CBC WITH DIFFERENTIAL/PLATELET
Abs Immature Granulocytes: 0.02 10*3/uL (ref 0.00–0.07)
Basophils Absolute: 0 10*3/uL (ref 0.0–0.1)
Basophils Relative: 0 %
Eosinophils Absolute: 0.1 10*3/uL (ref 0.0–0.5)
Eosinophils Relative: 1 %
HCT: 37.8 % (ref 36.0–46.0)
Hemoglobin: 12.4 g/dL (ref 12.0–15.0)
Immature Granulocytes: 0 %
Lymphocytes Relative: 21 %
Lymphs Abs: 2 10*3/uL (ref 0.7–4.0)
MCH: 26.8 pg (ref 26.0–34.0)
MCHC: 32.8 g/dL (ref 30.0–36.0)
MCV: 81.8 fL (ref 80.0–100.0)
Monocytes Absolute: 0.6 10*3/uL (ref 0.1–1.0)
Monocytes Relative: 6 %
Neutro Abs: 6.8 10*3/uL (ref 1.7–7.7)
Neutrophils Relative %: 72 %
Platelets: 254 10*3/uL (ref 150–400)
RBC: 4.62 MIL/uL (ref 3.87–5.11)
RDW: 13.3 % (ref 11.5–15.5)
WBC: 9.6 10*3/uL (ref 4.0–10.5)
nRBC: 0 % (ref 0.0–0.2)

## 2021-12-30 LAB — COMPREHENSIVE METABOLIC PANEL
ALT: 15 U/L (ref 0–44)
AST: 20 U/L (ref 15–41)
Albumin: 3.9 g/dL (ref 3.5–5.0)
Alkaline Phosphatase: 39 U/L (ref 38–126)
Anion gap: 10 (ref 5–15)
BUN: <5 mg/dL — ABNORMAL LOW (ref 6–20)
CO2: 25 mmol/L (ref 22–32)
Calcium: 8.7 mg/dL — ABNORMAL LOW (ref 8.9–10.3)
Chloride: 105 mmol/L (ref 98–111)
Creatinine, Ser: 0.98 mg/dL (ref 0.44–1.00)
GFR, Estimated: >60 mL/min (ref >60)
Glucose, Bld: 92 mg/dL (ref 70–99)
Potassium: 3.5 mmol/L (ref 3.5–5.1)
Sodium: 140 mmol/L (ref 135–145)
Total Bilirubin: 0.6 mg/dL (ref 0.3–1.2)
Total Protein: 6.9 g/dL (ref 6.5–8.1)

## 2021-12-30 LAB — AMMONIA: Ammonia: 42 umol/L — ABNORMAL HIGH (ref 9–35)

## 2021-12-30 LAB — ETHANOL: Alcohol, Ethyl (B): <10 mg/dL (ref <10)

## 2021-12-30 LAB — PREGNANCY, URINE: Preg Test, Ur: NEGATIVE
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
## 2021-12-30 NOTE — ED Notes (Signed)
Pt has been removed from the floor. Nt has not been able to locate.

## 2022-01-15 ENCOUNTER — Emergency Department (HOSPITAL_BASED_OUTPATIENT_CLINIC_OR_DEPARTMENT_OTHER): Payer: No Typology Code available for payment source

## 2022-01-15 ENCOUNTER — Encounter (HOSPITAL_BASED_OUTPATIENT_CLINIC_OR_DEPARTMENT_OTHER): Payer: Self-pay | Admitting: Emergency Medicine

## 2022-01-15 ENCOUNTER — Other Ambulatory Visit: Payer: Self-pay

## 2022-01-15 ENCOUNTER — Emergency Department (HOSPITAL_BASED_OUTPATIENT_CLINIC_OR_DEPARTMENT_OTHER)
Admission: EM | Admit: 2022-01-15 | Discharge: 2022-01-15 | Discharge status: Against Medical Advice | Payer: No Typology Code available for payment source | Attending: Emergency Medicine | Admitting: Emergency Medicine

## 2022-01-15 DIAGNOSIS — R0602 Shortness of breath: Secondary | ICD-10-CM | POA: Insufficient documentation

## 2022-01-15 DIAGNOSIS — R600 Localized edema: Secondary | ICD-10-CM | POA: Diagnosis not present

## 2022-01-15 DIAGNOSIS — R0789 Other chest pain: Secondary | ICD-10-CM | POA: Insufficient documentation

## 2022-01-15 LAB — CBC
HCT: 36.6 % (ref 36.0–46.0)
Hemoglobin: 12.2 g/dL (ref 12.0–15.0)
MCH: 26.7 pg (ref 26.0–34.0)
MCHC: 33.3 g/dL (ref 30.0–36.0)
MCV: 80.1 fL (ref 80.0–100.0)
Platelets: 256 10*3/uL (ref 150–400)
RBC: 4.57 MIL/uL (ref 3.87–5.11)
RDW: 13.2 % (ref 11.5–15.5)
WBC: 9.3 10*3/uL (ref 4.0–10.5)
nRBC: 0 % (ref 0.0–0.2)

## 2022-01-15 LAB — BASIC METABOLIC PANEL
Anion gap: 7 (ref 5–15)
BUN: 7 mg/dL (ref 6–20)
CO2: 27 mmol/L (ref 22–32)
Calcium: 8.9 mg/dL (ref 8.9–10.3)
Chloride: 106 mmol/L (ref 98–111)
Creatinine, Ser: 0.96 mg/dL (ref 0.44–1.00)
GFR, Estimated: >60 mL/min (ref >60)
Glucose, Bld: 132 mg/dL — ABNORMAL HIGH (ref 70–99)
Potassium: 3.5 mmol/L (ref 3.5–5.1)
Sodium: 140 mmol/L (ref 135–145)

## 2022-01-15 LAB — BRAIN NATRIURETIC PEPTIDE: B Natriuretic Peptide: 24.4 pg/mL (ref 0.0–100.0)

## 2022-01-15 LAB — TROPONIN I (HIGH SENSITIVITY): Troponin I (High Sensitivity): <2 ng/L (ref <18)

## 2022-01-15 LAB — PREGNANCY, URINE: Preg Test, Ur: NEGATIVE

## 2022-01-15 NOTE — ED Provider Notes (Signed)
[facility] EMERGENCY DEPARTMENT Provider Note   CSN: [id number] Arrival date & time: 01/15/22  2002     History  Chief Complaint  Patient presents with   Back Pain   Leg Swelling    Cathy Reid is a 36 y.o. female past medical history here for evaluation of multiple complaints.  Recently went on a road trip.  Since then has noted swelling and pain to her bilateral lower extremities.  No history of CHF.  Also noted some chest tightness and some shortness of breath.  States she did have mechanical fall.  No numbness or weakness.  History of PE or DVT.  Has not taken anything for symptoms.  Family is concerned about blood clot leading here to the ED.  No headache or abdominal pain.  Denies chance of pregnancy.  HPI     Home Medications Prior to Admission medications   Medication Sig Start Date End Date Taking? Authorizing Provider  hydrocortisone cream 1 % Apply to affected area 2 times daily 12/22/21   Palumbo, April, MD      Allergies    Ciprofloxacin, Naproxen, and Vicodin [hydrocodone-acetaminophen]    Review of Systems   Review of Systems  Constitutional: Negative.   HENT: Negative.    Respiratory:  Positive for shortness of breath.   Cardiovascular:  Positive for chest pain and leg swelling. Negative for palpitations.  Gastrointestinal: Negative.   Genitourinary: Negative.   Musculoskeletal: Negative.   Skin: Negative.   Neurological: Negative.   All other systems reviewed and are negative.   Physical Exam Updated Vital Signs BP 139/80 (BP Location: Left Arm)   Pulse 78   Temp 98.8 F (37.1 C) (Oral)   Resp 18   Ht 5\' 2"  (1.575 m)   Wt 72.6 kg   LMP 12/25/2021 (Approximate)   SpO2 95%   BMI 29.26 kg/m  Physical Exam Vitals and nursing note reviewed.  Constitutional:      General: She is not in acute distress.    Appearance: She is well-developed. She is not ill-appearing, toxic-appearing or diaphoretic.  HENT:     Head: Normocephalic  and atraumatic.  Eyes:     Pupils: Pupils are equal, round, and reactive to light.  Cardiovascular:     Rate and Rhythm: Normal rate.     Pulses: Normal pulses.          Dorsalis pedis pulses are 2+ on the right side and 2+ on the left side.     Heart sounds: Normal heart sounds.  Pulmonary:     Effort: Pulmonary effort is normal. No respiratory distress.     Breath sounds: Normal breath sounds.  Abdominal:     General: Bowel sounds are normal. There is no distension.     Palpations: Abdomen is soft.  Musculoskeletal:        General: No swelling, tenderness, deformity or signs of injury. Normal range of motion.     Cervical back: Normal range of motion.     Right lower leg: Edema present.     Left lower leg: Edema present.     Comments: Soft tissue swelling bilateral lower extremities, nonpitting edema.  No bony tenderness, full range of motion.  Skin:    General: Skin is warm and dry.     Capillary Refill: Capillary refill takes less than 2 seconds.     Comments: No edema, erythema or warm  Neurological:     General: No focal deficit present.  Mental Status: She is alert and oriented to person, place, and time.  Psychiatric:        Mood and Affect: Mood normal.    ED Results / Procedures / Treatments   Labs (all labs ordered are listed, but only abnormal results are displayed) Labs Reviewed  BASIC METABOLIC PANEL - Abnormal; Notable for the following components:      Result Value   Glucose, Bld 132 (*)    All other components within normal limits  CBC  PREGNANCY, URINE  BRAIN NATRIURETIC PEPTIDE  TROPONIN I (HIGH SENSITIVITY)  TROPONIN I (HIGH SENSITIVITY)    EKG None  Radiology US Venous Img Lower Bilateral  Result Date: 01/15/2022 CLINICAL DATA:  Swelling, pain EXAM: BILATERAL LOWER EXTREMITY VENOUS DOPPLER ULTRASOUND TECHNIQUE: Gray-scale sonography with compression, as well as color and duplex ultrasound, were performed to evaluate the deep venous system(s)  from the level of the common femoral vein through the popliteal and proximal calf veins. COMPARISON:  None Available. FINDINGS: VENOUS Normal compressibility of the common femoral, superficial femoral, and popliteal veins, as well as the visualized calf veins. Visualized portions of profunda femoral vein and great saphenous vein unremarkable. No filling defects to suggest DVT on grayscale or color Doppler imaging. Doppler waveforms show normal direction of venous flow, normal respiratory plasticity and response to augmentation. OTHER None. Limitations: none IMPRESSION: Negative. Electronically Signed   By: Charline Bills M.D.   On: 01/15/2022 23:33   DG Chest 2 View  Result Date: 01/15/2022 CLINICAL DATA:  Shortness of breath.  Foot swelling for 2 days. EXAM: CHEST - 2 VIEW COMPARISON:  07/24/2017 FINDINGS: The heart size and mediastinal contours are within normal limits. Both lungs are clear. The visualized skeletal structures are unremarkable. IMPRESSION: No active cardiopulmonary disease. Electronically Signed   By: Jeronimo Greaves M.D.   On: 01/15/2022 20:36    Procedures Procedures    Medications Ordered in ED Medications - No data to display  ED Course/ Medical Decision Making/ A&P    36  year old here for evaluation of lateral lower extremity swelling, pain as well as chest pain and shortness of breath in setting of recent road trip.  No history of PE or DVT.  She appears otherwise well.  She does have some soft tissue swelling however no pitting edema, erythema or warmth.  Her compartments are soft.  She is neurovascularly intact.  Her heart and lungs are clear. Cannot PERC  Labs and imaging personally viewed and interpreted:  CBC without leukocytosis Basic metabolic panel without significant abnormality BNP 24 Pregnancy test negative Delta troponin less than 2 Chest xray without cardiomegaly, pulm edema, pneumothorax EKG without ischemic changes [place] negative for DVT CTA chest not  performed as patient refused  Nursing attempted place IV in patient's arm.  She pulled her arm back and stated that IVs were too painful.  She is refusing any further imaging here in the emergency department.  I discussed risk versus benefit given her lower extremity swelling, shortness of breath and back pain.  She continues to refuse any further work-up.  Return for new or worsening symptoms.  Patient leaves AGAINST MEDICAL ADVICE  We discussed the nature and purpose, risks and benefits, as well as, the alternatives of treatment. Time was given to allow the opportunity to ask questions and consider their options, and after the discussion, the patient decided to refuse the offerred treatment. The patient was informed that refusal could lead to, but was not limited to,  death, permanent disability, or severe pain. If present, I asked the relatives or significant others to dissuade them without success. Prior to refusing, I determined that the patient had the capacity to make their decision and understood the consequences of that decision. After refusal, I made every reasonable opportunity to treat them to the best of my ability.  The patient was notified that they may return to the emergency department at any time for further treatment.                              Medical Decision Making Amount and/or Complexity of Data Reviewed Independent Historian: friend External Data Reviewed: labs, radiology, ECG and notes. Labs: ordered. Decision-making details documented in ED Course. Radiology: ordered and independent interpretation performed. Decision-making details documented in ED Course. ECG/medicine tests: ordered and independent interpretation performed. Decision-making details documented in ED Course.          Final Clinical Impression(s) / ED Diagnoses Final diagnoses:  Bilateral lower extremity edema  SOB (shortness of breath)    Rx / DC Orders ED Discharge Orders     None          Maurianna Benard A, PA-C 01/15/22 2339    Edwin Dada P, DO 01/15/22 2354

## 2022-01-15 NOTE — ED Triage Notes (Signed)
Pt also complains of midsternal chest pain and SOB for the past few days.

## 2022-01-15 NOTE — ED Notes (Signed)
Went in to have pt sign AMA. Pt very angry and argumentative. Asked pt if she wanted some one else to try for IV so she could get the CT. Pt states, "No I want to get out of here." Pt refused to sign or have VS done. As I was leaving the room pt states "get the fuck out of my face."

## 2022-01-15 NOTE — ED Triage Notes (Signed)
Back pain and bilaterally feet swelling x 2 days.

## 2022-01-15 NOTE — Discharge Instructions (Addendum)
Ultrasound of your legs did not show any blood clots.  You did not want the IV for the CT scan of your chest.  Return for new or worsening symptoms.  Make sure to get compression socks and elevate your legs.

## 2022-01-15 NOTE — ED Notes (Addendum)
PIV attempted in R arm. Pt did not tolerate well. Told this RN to remove it and that she didn't want the CT if it required an IV. PA notified.

## 2022-03-11 ENCOUNTER — Emergency Department (HOSPITAL_BASED_OUTPATIENT_CLINIC_OR_DEPARTMENT_OTHER)
Admission: EM | Admit: 2022-03-11 | Discharge: 2022-03-11 | Disposition: A | Discharge status: Home / Self Care | Payer: No Typology Code available for payment source | Attending: Emergency Medicine | Admitting: Emergency Medicine

## 2022-03-11 ENCOUNTER — Emergency Department (HOSPITAL_BASED_OUTPATIENT_CLINIC_OR_DEPARTMENT_OTHER): Payer: No Typology Code available for payment source

## 2022-03-11 ENCOUNTER — Encounter (HOSPITAL_BASED_OUTPATIENT_CLINIC_OR_DEPARTMENT_OTHER): Payer: Self-pay

## 2022-03-11 DIAGNOSIS — N83201 Unspecified ovarian cyst, right side: Secondary | ICD-10-CM | POA: Insufficient documentation

## 2022-03-11 DIAGNOSIS — F17200 Nicotine dependence, unspecified, uncomplicated: Secondary | ICD-10-CM | POA: Diagnosis not present

## 2022-03-11 DIAGNOSIS — R0789 Other chest pain: Secondary | ICD-10-CM | POA: Diagnosis not present

## 2022-03-11 DIAGNOSIS — R1084 Generalized abdominal pain: Secondary | ICD-10-CM

## 2022-03-11 DIAGNOSIS — R1030 Lower abdominal pain, unspecified: Secondary | ICD-10-CM | POA: Diagnosis present

## 2022-03-11 LAB — URINALYSIS, ROUTINE W REFLEX MICROSCOPIC
Bilirubin Urine: NEGATIVE
Glucose, UA: NEGATIVE mg/dL
Hgb urine dipstick: NEGATIVE
Ketones, ur: NEGATIVE mg/dL
Leukocytes,Ua: NEGATIVE
Nitrite: NEGATIVE
Protein, ur: NEGATIVE mg/dL
Specific Gravity, Urine: 1.025 (ref 1.005–1.030)
pH: 6 (ref 5.0–8.0)

## 2022-03-11 LAB — COMPREHENSIVE METABOLIC PANEL
ALT: 14 U/L (ref 0–44)
AST: 19 U/L (ref 15–41)
Albumin: 3.9 g/dL (ref 3.5–5.0)
Alkaline Phosphatase: 46 U/L (ref 38–126)
Anion gap: 5 (ref 5–15)
BUN: 9 mg/dL (ref 6–20)
CO2: 29 mmol/L (ref 22–32)
Calcium: 8.8 mg/dL — ABNORMAL LOW (ref 8.9–10.3)
Chloride: 103 mmol/L (ref 98–111)
Creatinine, Ser: 1.14 mg/dL — ABNORMAL HIGH (ref 0.44–1.00)
GFR, Estimated: >60 mL/min (ref >60)
Glucose, Bld: 102 mg/dL — ABNORMAL HIGH (ref 70–99)
Potassium: 3.4 mmol/L — ABNORMAL LOW (ref 3.5–5.1)
Sodium: 137 mmol/L (ref 135–145)
Total Bilirubin: 0.5 mg/dL (ref 0.3–1.2)
Total Protein: 7.3 g/dL (ref 6.5–8.1)

## 2022-03-11 LAB — CBC WITH DIFFERENTIAL/PLATELET
Abs Immature Granulocytes: 0.05 10*3/uL (ref 0.00–0.07)
Basophils Absolute: 0.1 10*3/uL (ref 0.0–0.1)
Basophils Relative: 1 %
Eosinophils Absolute: 0.2 10*3/uL (ref 0.0–0.5)
Eosinophils Relative: 1 %
HCT: 39 % (ref 36.0–46.0)
Hemoglobin: 13.3 g/dL (ref 12.0–15.0)
Immature Granulocytes: 0 %
Lymphocytes Relative: 24 %
Lymphs Abs: 3.2 10*3/uL (ref 0.7–4.0)
MCH: 26.8 pg (ref 26.0–34.0)
MCHC: 34.1 g/dL (ref 30.0–36.0)
MCV: 78.6 fL — ABNORMAL LOW (ref 80.0–100.0)
Monocytes Absolute: 0.8 10*3/uL (ref 0.1–1.0)
Monocytes Relative: 6 %
Neutro Abs: 9.3 10*3/uL — ABNORMAL HIGH (ref 1.7–7.7)
Neutrophils Relative %: 68 %
Platelets: 252 10*3/uL (ref 150–400)
RBC: 4.96 MIL/uL (ref 3.87–5.11)
RDW: 13.3 % (ref 11.5–15.5)
WBC: 13.6 10*3/uL — ABNORMAL HIGH (ref 4.0–10.5)
nRBC: 0 % (ref 0.0–0.2)

## 2022-03-11 LAB — PREGNANCY, URINE: Preg Test, Ur: NEGATIVE

## 2022-03-11 LAB — LIPASE, BLOOD: Lipase: 49 U/L (ref 11–51)

## 2022-03-11 LAB — TROPONIN I (HIGH SENSITIVITY): Troponin I (High Sensitivity): <2 ng/L (ref <18)

## 2022-03-11 MED ORDER — OXYCODONE-ACETAMINOPHEN 5-325 MG PO TABS: 1.0000 | ORAL_TABLET | ORAL | 0 refills | Status: DC | PRN

## 2022-03-11 MED ORDER — IOHEXOL 300 MG/ML  SOLN
100.0000 mL | Freq: Once | INTRAMUSCULAR | Status: AC | PRN
  Administered 2022-03-11: 100 mL via INTRAVENOUS

## 2022-03-11 MED ORDER — SODIUM CHLORIDE 0.9 % IV BOLUS
500.0000 mL | Freq: Once | INTRAVENOUS | Status: AC
  Administered 2022-03-11: 500 mL via INTRAVENOUS

## 2022-03-11 NOTE — ED Provider Notes (Signed)
7:39 AM Care assumed to Dr. Ralene Bathe.  At time of transfer care, patient is waiting for results of CT abdomen pelvis and urinalysis to rule out concerning etiologies of abdominal discomfort this morning.  Work-up is reassuring, anticipate discharge home.  CT scan showed ovarian cyst but otherwise no concerning findings.  We discussed this finding and that she needs repeat ultrasound.  We also offered pelvic exam and ultrasound but she did not want it.  She agrees with some pain medicine and PCP follow-up with OB/GYN follow-up.  She had other questions or concerns and was discharged with improved symptoms.   Clinical Impression: 1. Generalized abdominal pain   2. Cyst of right ovary     Disposition: Discharge  Condition: Good  I have discussed the results, Dx and Tx plan with the pt(& family if present). He/she/they expressed understanding and agree(s) with the plan. Discharge instructions discussed at great length. Strict return precautions discussed and pt &/or family have verbalized understanding of the instructions. No further questions at time of discharge.    New Prescriptions   OXYCODONE-ACETAMINOPHEN (PERCOCET/ROXICET) 5-325 MG TABLET    Take 1 tablet by mouth every 4 (four) hours as needed for severe pain.    Follow Up: No follow-up provider specified.    Galen Malkowski, Gwenyth Allegra, MD 03/11/22 1530

## 2022-03-11 NOTE — ED Provider Notes (Signed)
[facility] EMERGENCY DEPARTMENT Provider Note   CSN: [id number] Arrival date & time: 03/11/22  3790     History  Chief Complaint  Patient presents with   Chest Pain   Abdominal Pain    Cathy Reid is a 36 y.o. female.  The history is provided by the patient and medical records.  Chest Pain Associated symptoms: abdominal pain   Abdominal Pain Associated symptoms: chest pain    Cathy Reid is a 36 y.o. female who presents to the Emergency Department complaining of chest pain.  She presents to the emergency department accompanied by her significant other for evaluation of chest pain and back pain that started on Saturday morning.  Pain is described as sharp in nature and waxes and wanes.  Episodes last for about 1 to 2 minutes at a time and she is experiencing frequent episodes.  When she has the discomfort she is short of breath but she has no difficulty breathing in between episodes.  She also has associated lower abdominal pressure that began at the same time as the chest pain and she experiences this discomfort when she experiences the chest pain.  She did vomit once but has no nausea.  No diarrhea, dysuria, vaginal discharge, fever, cough.  She reports several months of bilateral lower extremity edema.  No known medical problems.  No medications.  Lmp aug 29.    Smokes tobacco.  No alcohol, street drugs.  No prior abdominal surgeries.  No recent injuries.  No hx/o DVT/PE.    Family hx/o HTN, HPL, DM.      Home Medications Prior to Admission medications   Medication Sig Start Date End Date Taking? Authorizing Provider  hydrocortisone cream 1 % Apply to affected area 2 times daily 12/22/21   Palumbo, April, MD      Allergies    Ciprofloxacin and Naproxen    Review of Systems   Review of Systems  Cardiovascular:  Positive for chest pain.  Gastrointestinal:  Positive for abdominal pain.  All other systems reviewed and are negative.   Physical  Exam Updated Vital Signs BP (!) 146/85 (BP Location: Left Arm)   Pulse 74   Temp 97.8 F (36.6 C) (Oral)   Resp 16   Ht 5\' 2"  (1.575 m)   Wt 72.6 kg   LMP 02/20/2022 (Approximate)   SpO2 100%   BMI 29.26 kg/m  Physical Exam Vitals and nursing note reviewed.  Constitutional:      Appearance: She is well-developed.  HENT:     Head: Normocephalic and atraumatic.  Cardiovascular:     Rate and Rhythm: Normal rate and regular rhythm.     Heart sounds: No murmur heard. Pulmonary:     Effort: Pulmonary effort is normal. No respiratory distress.     Breath sounds: Normal breath sounds.  Abdominal:     Palpations: Abdomen is soft.     Tenderness: There is no guarding or rebound.     Comments: Mild RLQ and lower abdominal tenderness  Musculoskeletal:        General: No tenderness.  Skin:    General: Skin is warm and dry.  Neurological:     Mental Status: She is alert and oriented to person, place, and time.  Psychiatric:        Behavior: Behavior normal.     ED Results / Procedures / Treatments   Labs (all labs ordered are listed, but only abnormal results are displayed) Labs Reviewed - No data  to display  EKG EKG Interpretation  Date/Time:  Sunday March 11 2022 05:25:11 EDT Ventricular Rate:  72 PR Interval:  154 QRS Duration: 87 QT Interval:  405 QTC Calculation: 444 R Axis:   77 Text Interpretation: Sinus rhythm Confirmed by Quintella Reichert [phone]) on 03/11/2022 5:34:46 AM  Radiology No results found.  Procedures Procedures    Medications Ordered in ED Medications - No data to display  ED Course/ Medical Decision Making/ A&P                           Medical Decision Making Amount and/or Complexity of Data Reviewed Labs: ordered. Radiology: ordered.   Patient here for evaluation of chest pain, back pain, abdominal pain.  Patient with mild lower abdominal tenderness on examination.  EKG is without acute ischemic changes.  Current clinical picture  is not consistent with ACS, PE, dissection.  She does have leukocytosis, she does have right lower quadrant abdominal tenderness.  Plan to obtain CT abdomen pelvis to further evaluate her pain.  Patient care transferred pending urinalysis and CT scan.        Final Clinical Impression(s) / ED Diagnoses Final diagnoses:  None    Rx / DC Orders ED Discharge Orders     None         Quintella Reichert, MD 03/11/22 [phone]

## 2022-03-11 NOTE — ED Notes (Signed)
Pt transported to CT ?

## 2022-03-11 NOTE — ED Triage Notes (Signed)
Pt ambulatory to triage with c/o chest pain x2 days and abdominal/pelvic cramping since 0500 this morning. Chest pain is described as intermittent sharp central chest pain and pressure that radiates to back. Pt endorses SOB when in pain. Denies any other associated symptoms.

## 2022-03-20 ENCOUNTER — Other Ambulatory Visit: Payer: Self-pay

## 2022-03-20 ENCOUNTER — Other Ambulatory Visit (HOSPITAL_BASED_OUTPATIENT_CLINIC_OR_DEPARTMENT_OTHER): Payer: Self-pay

## 2022-03-20 ENCOUNTER — Emergency Department (HOSPITAL_BASED_OUTPATIENT_CLINIC_OR_DEPARTMENT_OTHER)
Admission: EM | Admit: 2022-03-20 | Discharge: 2022-03-20 | Disposition: A | Discharge status: Home / Self Care | Payer: No Typology Code available for payment source | Attending: Emergency Medicine | Admitting: Emergency Medicine

## 2022-03-20 ENCOUNTER — Encounter (HOSPITAL_BASED_OUTPATIENT_CLINIC_OR_DEPARTMENT_OTHER): Payer: Self-pay

## 2022-03-20 ENCOUNTER — Emergency Department (HOSPITAL_BASED_OUTPATIENT_CLINIC_OR_DEPARTMENT_OTHER): Payer: No Typology Code available for payment source

## 2022-03-20 DIAGNOSIS — R102 Pelvic and perineal pain: Secondary | ICD-10-CM | POA: Diagnosis present

## 2022-03-20 DIAGNOSIS — Z20822 Contact with and (suspected) exposure to covid-19: Secondary | ICD-10-CM | POA: Insufficient documentation

## 2022-03-20 DIAGNOSIS — J069 Acute upper respiratory infection, unspecified: Secondary | ICD-10-CM | POA: Insufficient documentation

## 2022-03-20 DIAGNOSIS — N83201 Unspecified ovarian cyst, right side: Secondary | ICD-10-CM | POA: Diagnosis not present

## 2022-03-20 LAB — CBC WITH DIFFERENTIAL/PLATELET
Abs Immature Granulocytes: 0.02 10*3/uL (ref 0.00–0.07)
Basophils Absolute: 0.1 10*3/uL (ref 0.0–0.1)
Basophils Relative: 1 %
Eosinophils Absolute: 0.6 10*3/uL — ABNORMAL HIGH (ref 0.0–0.5)
Eosinophils Relative: 7 %
HCT: 38.2 % (ref 36.0–46.0)
Hemoglobin: 13.1 g/dL (ref 12.0–15.0)
Immature Granulocytes: 0 %
Lymphocytes Relative: 32 %
Lymphs Abs: 3.2 10*3/uL (ref 0.7–4.0)
MCH: 27 pg (ref 26.0–34.0)
MCHC: 34.3 g/dL (ref 30.0–36.0)
MCV: 78.8 fL — ABNORMAL LOW (ref 80.0–100.0)
Monocytes Absolute: 0.9 10*3/uL (ref 0.1–1.0)
Monocytes Relative: 9 %
Neutro Abs: 5.1 10*3/uL (ref 1.7–7.7)
Neutrophils Relative %: 51 %
Platelets: 274 10*3/uL (ref 150–400)
RBC: 4.85 MIL/uL (ref 3.87–5.11)
RDW: 13.6 % (ref 11.5–15.5)
WBC: 9.8 10*3/uL (ref 4.0–10.5)
nRBC: 0 % (ref 0.0–0.2)

## 2022-03-20 LAB — URINALYSIS, ROUTINE W REFLEX MICROSCOPIC
Bilirubin Urine: NEGATIVE
Glucose, UA: NEGATIVE mg/dL
Ketones, ur: NEGATIVE mg/dL
Nitrite: NEGATIVE
Protein, ur: NEGATIVE mg/dL
Specific Gravity, Urine: 1.025 (ref 1.005–1.030)
pH: 5.5 (ref 5.0–8.0)

## 2022-03-20 LAB — BASIC METABOLIC PANEL
Anion gap: 7 (ref 5–15)
BUN: 7 mg/dL (ref 6–20)
CO2: 26 mmol/L (ref 22–32)
Calcium: 8.7 mg/dL — ABNORMAL LOW (ref 8.9–10.3)
Chloride: 105 mmol/L (ref 98–111)
Creatinine, Ser: 1.07 mg/dL — ABNORMAL HIGH (ref 0.44–1.00)
GFR, Estimated: >60 mL/min (ref >60)
Glucose, Bld: 114 mg/dL — ABNORMAL HIGH (ref 70–99)
Potassium: 3.9 mmol/L (ref 3.5–5.1)
Sodium: 138 mmol/L (ref 135–145)

## 2022-03-20 LAB — URINALYSIS, MICROSCOPIC (REFLEX)

## 2022-03-20 LAB — PREGNANCY, URINE: Preg Test, Ur: NEGATIVE

## 2022-03-20 LAB — RPR: RPR Ser Ql: NONREACTIVE

## 2022-03-20 LAB — SARS CORONAVIRUS 2 BY RT PCR: SARS Coronavirus 2 by RT PCR: NEGATIVE

## 2022-03-20 LAB — HIV ANTIBODY (ROUTINE TESTING W REFLEX): HIV Screen 4th Generation wRfx: NONREACTIVE

## 2022-03-20 MED ORDER — OXYCODONE-ACETAMINOPHEN 5-325 MG PO TABS
1.0000 | ORAL_TABLET | Freq: Three times a day (TID) | ORAL | 0 refills | Status: AC | PRN
  Filled 2022-03-20: qty 6, 1d supply, fill #0

## 2022-03-20 MED ORDER — KETOROLAC TROMETHAMINE 15 MG/ML IJ SOLN
15.0000 mg | Freq: Once | INTRAMUSCULAR | Status: AC
  Administered 2022-03-20: 15 mg via INTRAMUSCULAR
  Filled 2022-03-20: qty 1

## 2022-03-20 MED ORDER — IBUPROFEN 600 MG PO TABS
600.0000 mg | ORAL_TABLET | Freq: Three times a day (TID) | ORAL | 0 refills | Status: AC | PRN
  Filled 2022-03-20: qty 10, 4d supply, fill #0

## 2022-03-20 NOTE — ED Triage Notes (Signed)
Patient arrives POV from home c/o nasal congestion that started last night and pelvic pain that "has been going on for weeks." Pt states she has hx of ovarian cysts. Pt  a&o x4, NAD.

## 2022-03-20 NOTE — ED Provider Notes (Signed)
[facility] EMERGENCY DEPARTMENT Provider Note   CSN: [id number] Arrival date & time: 03/20/22  0006     History  Chief Complaint  Patient presents with   nasal congestion/pelvic pain    Cathy Reid is a 36 y.o. female.  HPI Patient presents with 2 different complaints.  Nasal congestion for the last couple days.  Some drainage.  Feels tight.  Mild pain.  States when she blows her nose a fair amount comes out but not running much otherwise.  No sore throat.  No cough.  No fevers. Also pelvic pain.  Has been going for weeks.  Seen in the ER 9 days ago for the same.  Had CT scan that showed ovarian cyst.  Pain is in the lower abdomen/pelvis.  Did have a menses 4 days ago that was a little heavier than normal.  States small amount of spotting now.  Still pain in the in the abdomen.  No dysuria.  Had not been able to get into her OB yet.  Denies vaginal discharge.    Home Medications Prior to Admission medications   Medication Sig Start Date End Date Taking? Authorizing Provider  ibuprofen (ADVIL) 600 MG tablet Take 1 tablet (600 mg total) by mouth every 8 (eight) hours as needed. 03/20/22  Yes Benjiman Core, MD  oxyCODONE-acetaminophen (PERCOCET/ROXICET) 5-325 MG tablet Take 1-2 tablets by mouth every 8 (eight) hours as needed for severe pain. 03/20/22  Yes Benjiman Core, MD  hydrocortisone cream 1 % Apply to affected area 2 times daily 12/22/21   Palumbo, April, MD      Allergies    Ciprofloxacin and Naproxen    Review of Systems   Review of Systems  Physical Exam Updated Vital Signs BP (!) 145/79   Pulse 74   Temp 98.6 F (37 C) (Oral)   Resp 18   Ht 5\' 2"  (1.575 m)   Wt 87.1 kg   LMP 02/20/2022 (Approximate) Comment: negative preg test  SpO2 100%   BMI 35.12 kg/m  Physical Exam Vitals and nursing note reviewed.  HENT:     Head: Atraumatic.     Nose: Congestion present.  Cardiovascular:     Rate and Rhythm: Regular rhythm.  Pulmonary:      Breath sounds: No wheezing or rhonchi.  Abdominal:     Tenderness: There is abdominal tenderness.     Comments: Lower abdominal tenderness rebound or guarding.  More tender on right side.  Musculoskeletal:        General: No tenderness.     Cervical back: Neck supple.  Skin:    General: Skin is warm.     Capillary Refill: Capillary refill takes less than 2 seconds.  Neurological:     Mental Status: She is alert and oriented to person, place, and time.     ED Results / Procedures / Treatments   Labs (all labs ordered are listed, but only abnormal results are displayed) Labs Reviewed  URINALYSIS, ROUTINE W REFLEX MICROSCOPIC - Abnormal; Notable for the following components:      Result Value   APPearance HAZY (*)    Hgb urine dipstick SMALL (*)    Leukocytes,Ua TRACE (*)    All other components within normal limits  URINALYSIS, MICROSCOPIC (REFLEX) - Abnormal; Notable for the following components:   Bacteria, UA FEW (*)    All other components within normal limits  CBC WITH DIFFERENTIAL/PLATELET - Abnormal; Notable for the following components:   MCV 78.8 (*)  Eosinophils Absolute 0.6 (*)    All other components within normal limits  BASIC METABOLIC PANEL - Abnormal; Notable for the following components:   Glucose, Bld 114 (*)    Creatinine, Ser 1.07 (*)    Calcium 8.7 (*)    All other components within normal limits  SARS CORONAVIRUS 2 BY RT PCR  PREGNANCY, URINE  RPR  HIV ANTIBODY (ROUTINE TESTING W REFLEX)  GC/CHLAMYDIA PROBE AMP ([facility]) NOT AT [facility]    EKG None  Radiology US PELVIC COMPLETE W TRANSVAGINAL AND TORSION R/O  Result Date: 03/20/2022 CLINICAL DATA:  Pelvic pain EXAM: TRANSABDOMINAL AND TRANSVAGINAL ULTRASOUND OF PELVIS DOPPLER ULTRASOUND OF OVARIES TECHNIQUE: Both transabdominal and transvaginal ultrasound examinations of the pelvis were performed. Transabdominal technique was performed for global imaging of the pelvis including uterus, ovaries,  adnexal regions, and pelvic cul-de-sac. It was necessary to proceed with endovaginal exam following the transabdominal exam to visualize the ovaries radial juxta speaking. Color and duplex Doppler ultrasound was utilized to evaluate blood flow to the ovaries. COMPARISON:  None Available. FINDINGS: Uterus Measurements: 9.3 x 4.4 x 5.4 cm = volume: 115.2 mL. No fibroids or other mass visualized. Endometrium Thickness: 8.5 mm.  No focal abnormality visualized. Right ovary Measurements: 2.9 x 2.6 x 2.1 cm = volume: 7.6 mL. Normal appearance/no adnexal mass. Cystic lesion with focal area of wall thickening which demonstrates no internal blood flow on color Doppler imaging, measures 6.6 x 5.3 x 4.5 cm. Left ovary Measurements: 3.3 x 2.4 x 3.1 cm = volume: 13.2 mL. Normal appearance/no adnexal mass. Pulsed Doppler evaluation of both ovaries demonstrates normal low-resistance arterial and venous waveforms. Other findings No abnormal free fluid. IMPRESSION: 1. Normal blood flow visualized in the bilateral ovaries, although intermittent torsion can not be excluded given enlargement of the ovary secondary cystic lesion. 2. Cystic lesion of the right ovary focal area of wall thickening which demonstrates no vascularity, possibly a hemorrhagic cyst. Recommend follow-up ultrasound in 6-12 weeks to ensure resolution. Electronically Signed   By: Allegra Lai M.D.   On: 03/20/2022 08:45    Procedures Procedures    Medications Ordered in ED Medications  ketorolac (TORADOL) 15 MG/ML injection 15 mg (15 mg Intramuscular Given 03/20/22 0920)    ED Course/ Medical Decision Making/ A&P                           Medical Decision Making Amount and/or Complexity of Data Reviewed Labs: ordered. Radiology: ordered.  Risk Prescription drug management.   Patient with nasal congestion.  Negative COVID test.  Doubt severe bacterial infection.  Most likely viral syndrome.  Will require symptomatic treatment. However also  has pelvic pain.  Had been seen a week ago for the same.  Had CT scan that showed ovarian cyst.  Now with continued pain.  Slight amount of vaginal bleeding.  We will do pelvic exam get some basic blood work and get ultrasound.  With CT scan 9 days ago that did not show appendicitis do not think we need to repeat at this time unless there is severe laboratory abnormality but will get ultrasound.  Ultrasound showed ovarian cyst on right side.  No torsion at this time but cannot definitely rule out intermittent torsion.  Discussed with patient about pelvic exam.  At this point she defers.  I think this is reasonable considering no real discharge and patient states no chance of an STD.  Has OB/GYN she can  follow-up with.  We will give symptomatic treatment of some pain meds and NSAIDs.  Reviewing allergies it appears if she has had a naproxen allergy but has been able to tolerate ibuprofen in the past..  Toradol given here.  Will discharge home.        Final Clinical Impression(s) / ED Diagnoses Final diagnoses:  Upper respiratory tract infection, unspecified type  Cyst of right ovary    Rx / DC Orders ED Discharge Orders          Ordered    ibuprofen (ADVIL) 600 MG tablet  Every 8 hours PRN        03/20/22 0928    oxyCODONE-acetaminophen (PERCOCET/ROXICET) 5-325 MG tablet  Every 8 hours PRN        03/20/22 5974              Davonna Belling, MD 03/20/22 [phone]

## 2022-04-07 ENCOUNTER — Other Ambulatory Visit: Payer: Self-pay

## 2022-04-07 ENCOUNTER — Encounter (HOSPITAL_BASED_OUTPATIENT_CLINIC_OR_DEPARTMENT_OTHER): Payer: Self-pay | Admitting: Emergency Medicine

## 2022-04-07 ENCOUNTER — Emergency Department (HOSPITAL_BASED_OUTPATIENT_CLINIC_OR_DEPARTMENT_OTHER)
Admission: EM | Admit: 2022-04-07 | Discharge: 2022-04-07 | Disposition: A | Discharge status: Home / Self Care | Payer: No Typology Code available for payment source | Attending: Emergency Medicine | Admitting: Emergency Medicine

## 2022-04-07 ENCOUNTER — Emergency Department (HOSPITAL_BASED_OUTPATIENT_CLINIC_OR_DEPARTMENT_OTHER): Payer: No Typology Code available for payment source

## 2022-04-07 DIAGNOSIS — I10 Essential (primary) hypertension: Secondary | ICD-10-CM | POA: Insufficient documentation

## 2022-04-07 DIAGNOSIS — R1031 Right lower quadrant pain: Secondary | ICD-10-CM | POA: Diagnosis present

## 2022-04-07 DIAGNOSIS — N926 Irregular menstruation, unspecified: Secondary | ICD-10-CM | POA: Insufficient documentation

## 2022-04-07 DIAGNOSIS — N8301 Follicular cyst of right ovary: Secondary | ICD-10-CM | POA: Diagnosis not present

## 2022-04-07 DIAGNOSIS — N83201 Unspecified ovarian cyst, right side: Secondary | ICD-10-CM

## 2022-04-07 LAB — URINALYSIS, ROUTINE W REFLEX MICROSCOPIC
Bilirubin Urine: NEGATIVE
Glucose, UA: NEGATIVE mg/dL
Ketones, ur: NEGATIVE mg/dL
Leukocytes,Ua: NEGATIVE
Nitrite: NEGATIVE
Protein, ur: NEGATIVE mg/dL
Specific Gravity, Urine: 1.02 (ref 1.005–1.030)
pH: 6.5 (ref 5.0–8.0)

## 2022-04-07 LAB — CBC WITH DIFFERENTIAL/PLATELET
Abs Immature Granulocytes: 0.01 10*3/uL (ref 0.00–0.07)
Basophils Absolute: 0.1 10*3/uL (ref 0.0–0.1)
Basophils Relative: 1 %
Eosinophils Absolute: 0.4 10*3/uL (ref 0.0–0.5)
Eosinophils Relative: 4 %
HCT: 40.4 % (ref 36.0–46.0)
Hemoglobin: 13.7 g/dL (ref 12.0–15.0)
Immature Granulocytes: 0 %
Lymphocytes Relative: 27 %
Lymphs Abs: 2.5 10*3/uL (ref 0.7–4.0)
MCH: 26.3 pg (ref 26.0–34.0)
MCHC: 33.9 g/dL (ref 30.0–36.0)
MCV: 77.7 fL — ABNORMAL LOW (ref 80.0–100.0)
Monocytes Absolute: 0.5 10*3/uL (ref 0.1–1.0)
Monocytes Relative: 6 %
Neutro Abs: 5.6 10*3/uL (ref 1.7–7.7)
Neutrophils Relative %: 62 %
Platelets: 275 10*3/uL (ref 150–400)
RBC: 5.2 MIL/uL — ABNORMAL HIGH (ref 3.87–5.11)
RDW: 13.3 % (ref 11.5–15.5)
WBC: 9.1 10*3/uL (ref 4.0–10.5)
nRBC: 0 % (ref 0.0–0.2)

## 2022-04-07 LAB — URINALYSIS, MICROSCOPIC (REFLEX): WBC, UA: NONE SEEN WBC/hpf (ref 0–5)

## 2022-04-07 LAB — BASIC METABOLIC PANEL
Anion gap: 6 (ref 5–15)
BUN: 8 mg/dL (ref 6–20)
CO2: 26 mmol/L (ref 22–32)
Calcium: 8.5 mg/dL — ABNORMAL LOW (ref 8.9–10.3)
Chloride: 105 mmol/L (ref 98–111)
Creatinine, Ser: 0.97 mg/dL (ref 0.44–1.00)
GFR, Estimated: >60 mL/min (ref >60)
Glucose, Bld: 116 mg/dL — ABNORMAL HIGH (ref 70–99)
Potassium: 3.9 mmol/L (ref 3.5–5.1)
Sodium: 137 mmol/L (ref 135–145)

## 2022-04-07 LAB — PREGNANCY, URINE: Preg Test, Ur: NEGATIVE

## 2022-04-07 MED ORDER — ONDANSETRON HCL 4 MG/2ML IJ SOLN
4.0000 mg | Freq: Once | INTRAMUSCULAR | Status: AC
  Administered 2022-04-07: 4 mg via INTRAVENOUS
  Filled 2022-04-07: qty 2

## 2022-04-07 MED ORDER — SODIUM CHLORIDE 0.9 % IV BOLUS
1000.0000 mL | Freq: Once | INTRAVENOUS | Status: AC
  Administered 2022-04-07: 1000 mL via INTRAVENOUS

## 2022-04-07 MED ORDER — HYDROCODONE-ACETAMINOPHEN 5-325 MG PO TABS: 1.0000 | ORAL_TABLET | ORAL | 0 refills | Status: AC | PRN

## 2022-04-07 MED ORDER — MORPHINE SULFATE (PF) 4 MG/ML IV SOLN
4.0000 mg | Freq: Once | INTRAVENOUS | Status: AC
  Administered 2022-04-07: 4 mg via INTRAVENOUS
  Filled 2022-04-07: qty 1

## 2022-04-07 MED ORDER — HYDROCODONE-ACETAMINOPHEN 5-325 MG PO TABS: 1.0000 | ORAL_TABLET | ORAL | 0 refills | Status: DC | PRN

## 2022-04-07 NOTE — ED Provider Notes (Signed)
[facility] EMERGENCY DEPARTMENT Provider Note   CSN: [id number] Arrival date & time: 04/07/22  1546     History  Chief Complaint  Patient presents with   Abdominal Pain    Cathy Reid is a 36 y.o. female.  Pt is a 36 yo female with htn, hld, and ovarian cysts.  Pt has been having RLQ pain for the past few weeks.  She said her period started 2 days ago and today she developed worsening of RLQ pain.  Pt denies f/c.  No n/v.  Pt has had irregular periods.  She said she called her obgyn and made an appt.       Home Medications Prior to Admission medications   Medication Sig Start Date End Date Taking? Authorizing Provider  HYDROcodone-acetaminophen (NORCO/VICODIN) 5-325 MG tablet Take 1 tablet by mouth every 4 (four) hours as needed. 04/07/22  Yes Jacalyn Lefevre, MD  hydrocortisone cream 1 % Apply to affected area 2 times daily 12/22/21   Palumbo, April, MD  ibuprofen (ADVIL) 600 MG tablet Take 1 tablet (600 mg total) by mouth every 8 (eight) hours as needed. 03/20/22   Benjiman Core, MD  oxyCODONE-acetaminophen (PERCOCET/ROXICET) 5-325 MG tablet Take 1-2 tablets by mouth every 8 (eight) hours as needed for severe pain. 03/20/22   Benjiman Core, MD      Allergies    Ciprofloxacin and Naproxen    Review of Systems   Review of Systems  Gastrointestinal:  Positive for abdominal pain.  All other systems reviewed and are negative.   Physical Exam Updated Vital Signs BP (!) 140/75 (BP Location: Right Arm)   Pulse 81   Temp 98.1 F (36.7 C)   Resp 18   Ht 5\' 2"  (1.575 m)   Wt 86.2 kg   LMP 02/20/2022 (Approximate) Comment: negative preg test  SpO2 100%   BMI 34.75 kg/m  Physical Exam Vitals and nursing note reviewed.  Constitutional:      Appearance: She is well-developed. She is obese.  HENT:     Head: Normocephalic and atraumatic.     Mouth/Throat:     Mouth: Mucous membranes are moist.     Pharynx: Oropharynx is clear.  Eyes:      Extraocular Movements: Extraocular movements intact.     Pupils: Pupils are equal, round, and reactive to light.  Cardiovascular:     Rate and Rhythm: Normal rate and regular rhythm.     Heart sounds: Normal heart sounds.  Pulmonary:     Effort: Pulmonary effort is normal.     Breath sounds: Normal breath sounds.  Abdominal:     General: Abdomen is flat. Bowel sounds are normal.     Palpations: Abdomen is soft.     Tenderness: There is abdominal tenderness in the right lower quadrant.  Skin:    General: Skin is warm.     Capillary Refill: Capillary refill takes less than 2 seconds.  Neurological:     General: No focal deficit present.     Mental Status: She is alert and oriented to person, place, and time.  Psychiatric:        Mood and Affect: Mood normal.        Behavior: Behavior normal.     ED Results / Procedures / Treatments   Labs (all labs ordered are listed, but only abnormal results are displayed) Labs Reviewed  URINALYSIS, ROUTINE W REFLEX MICROSCOPIC - Abnormal; Notable for the following components:      Result Value  Hgb urine dipstick MODERATE (*)    All other components within normal limits  URINALYSIS, MICROSCOPIC (REFLEX) - Abnormal; Notable for the following components:   Bacteria, UA RARE (*)    All other components within normal limits  BASIC METABOLIC PANEL - Abnormal; Notable for the following components:   Glucose, Bld 116 (*)    Calcium 8.5 (*)    All other components within normal limits  CBC WITH DIFFERENTIAL/PLATELET - Abnormal; Notable for the following components:   RBC 5.20 (*)    MCV 77.7 (*)    All other components within normal limits  PREGNANCY, URINE    EKG None  Radiology US PELVIC COMPLETE W TRANSVAGINAL AND TORSION R/O  Result Date: 04/07/2022 CLINICAL DATA:  Right lower quadrant abdominal pain EXAM: TRANSABDOMINAL AND TRANSVAGINAL ULTRASOUND OF PELVIS DOPPLER ULTRASOUND OF OVARIES TECHNIQUE: Both transabdominal and  transvaginal ultrasound examinations of the pelvis were performed. Transabdominal technique was performed for global imaging of the pelvis including uterus, ovaries, adnexal regions, and pelvic cul-de-sac. It was necessary to proceed with endovaginal exam following the transabdominal exam to visualize the uterus, endometrium, ovaries, and adnexa. Color and duplex Doppler ultrasound was utilized to evaluate blood flow to the ovaries. COMPARISON:  03/20/2022 FINDINGS: Uterus Measurements: 9.5 x 4.4 x 5.0 cm = volume: 109 mL. No fibroids or other mass visualized. Endometrium Thickness: 0.9 cm.  No focal abnormality visualized. Right ovary Measurements: 4.0 x 3.0 x 4.0 cm = volume: 26 mL. Simple appearing cyst of the right ovary measuring 2.6 x 1.8 x 3.1 cm. Left ovary Measurements: 3.9 x 1.9 x 3.6 cm = volume: 14 mL. Normal appearance/no adnexal mass. Multiple small follicles. Pulsed Doppler evaluation of both ovaries demonstrates normal low-resistance arterial and venous waveforms. Other findings No abnormal free fluid. IMPRESSION: 1. No acute ultrasound abnormality of the pelvis to explain right lower quadrant pain. 2. Simple appearing cyst of the right ovary measuring up to 3.1 cm. This is markedly diminished in size compared to prior examination dated 03/20/2022, consistent with resolution of the hemorrhagic or proteinaceous cysts. No further follow-up or characterization is required. Electronically Signed   By: Jearld Lesch M.D.   On: 04/07/2022 18:25    Procedures Procedures    Medications Ordered in ED Medications  morphine (PF) 4 MG/ML injection 4 mg (4 mg Intravenous Given 04/07/22 1729)  ondansetron (ZOFRAN) injection 4 mg (4 mg Intravenous Given 04/07/22 1729)  sodium chloride 0.9 % bolus 1,000 mL (1,000 mLs Intravenous New Bag/Given 04/07/22 1728)    ED Course/ Medical Decision Making/ A&P                           Medical Decision Making Amount and/or Complexity of Data Reviewed Labs:  ordered. Radiology: ordered.  Risk Prescription drug management.   This patient presents to the ED for concern of abd pain, this involves an extensive number of treatment options, and is a complaint that carries with it a high risk of complications and morbidity.  The differential diagnosis includes pregnancy, ovarian cyst, appendicitis, uti   Co morbidities that complicate the patient evaluation  htn, hld, and ovarian cysts   Additional history obtained:  Additional history obtained from epic chart review    Lab Tests:  I Ordered, and personally interpreted labs.  The pertinent results include:  preg neg; ua + hgb, but she is on her period   Imaging Studies ordered:  I ordered imaging studies including [place]  I independently visualized and interpreted imaging which showed  1. No acute ultrasound abnormality of the pelvis to explain right  lower quadrant pain.  2. Simple appearing cyst of the right ovary measuring up to 3.1 cm.  This is markedly diminished in size compared to prior examination  dated 03/20/2022, consistent with resolution of the hemorrhagic or  proteinaceous cysts. No further follow-up or characterization is  required.   I agree with the radiologist interpretation   Cardiac Monitoring:  The patient was maintained on a cardiac monitor.  I personally viewed and interpreted the cardiac monitored which showed an underlying rhythm of: nsr   Medicines ordered and prescription drug management:  I ordered medication including morphine/zofran  for pain  Reevaluation of the patient after these medicines showed that the patient improved I have reviewed the patients home medicines and have made adjustments as needed   Test Considered:  [place]   Critical Interventions:  Pain control  Problem List / ED Course:  RLQ pain:  pt's cyst has improved and there's no evidence of torsion.  Pt is stable for d/c.  She is to keep her appt with obgyn.      Reevaluation:  After the interventions noted above, I reevaluated the patient and found that they have :improved   Social Determinants of Health:  Lives at home   Dispostion:  After consideration of the diagnostic results and the patients response to treatment, I feel that the patent would benefit from discharge with outpatient f/u.          Final Clinical Impression(s) / ED Diagnoses Final diagnoses:  Cyst of right ovary    Rx / DC Orders ED Discharge Orders          Ordered    HYDROcodone-acetaminophen (NORCO/VICODIN) 5-325 MG tablet  Every 4 hours PRN        04/07/22 1856              Isla Pence, MD 04/07/22 1857

## 2022-04-07 NOTE — ED Triage Notes (Signed)
RUQ pain  , Hx ovarian cyst x 3 weeks ago. Denies NV. Vaginal bleeding  x 2 days , knowing her cycle was 2 weeks ago .

## 2022-04-07 NOTE — ED Notes (Signed)
Patient transported to [facility] 

## 2022-05-26 ENCOUNTER — Other Ambulatory Visit: Payer: Self-pay

## 2022-05-26 ENCOUNTER — Encounter (HOSPITAL_BASED_OUTPATIENT_CLINIC_OR_DEPARTMENT_OTHER): Payer: Self-pay

## 2022-05-26 ENCOUNTER — Emergency Department (HOSPITAL_BASED_OUTPATIENT_CLINIC_OR_DEPARTMENT_OTHER): Payer: Self-pay

## 2022-05-26 ENCOUNTER — Emergency Department
Admission: EM | Admit: 2022-05-26 | Discharge: 2022-05-26 | Disposition: A | Discharge status: Home / Self Care | Payer: Self-pay | Attending: Emergency Medicine | Admitting: Emergency Medicine

## 2022-05-26 DIAGNOSIS — K5901 Slow transit constipation: Secondary | ICD-10-CM | POA: Insufficient documentation

## 2022-05-26 DIAGNOSIS — R1084 Generalized abdominal pain: Secondary | ICD-10-CM | POA: Insufficient documentation

## 2022-05-26 DIAGNOSIS — F17210 Nicotine dependence, cigarettes, uncomplicated: Secondary | ICD-10-CM

## 2022-05-26 DIAGNOSIS — R109 Unspecified abdominal pain: Secondary | ICD-10-CM

## 2022-05-26 HISTORY — DX: Unspecified ovarian cyst, unspecified side: N83.209

## 2022-05-26 LAB — URINALYSIS, MACRO/MICRO
BILIRUBIN: NEGATIVE mg/dL
BLOOD: NEGATIVE mg/dL
GLUCOSE: NEGATIVE mg/dL
KETONES: NEGATIVE mg/dL
LEUKOCYTES: NEGATIVE WBCs/uL
NITRITE: NEGATIVE
PH: 6 (ref 4.6–8.0)
PROTEIN: NEGATIVE mg/dL
SPECIFIC GRAVITY: 1.015 (ref 1.003–1.035)
UROBILINOGEN: 0.2 mg/dL (ref 0.2–1.0)

## 2022-05-26 LAB — HCG, URINE QUALITATIVE, PREGNANCY: HCG URINE QUALITATIVE: NEGATIVE

## 2022-05-26 MED ORDER — LACTULOSE 10 GRAM/15 ML ORAL SOLUTION
30.0000 mL | Freq: Every day | ORAL | 0 refills | Status: AC
Start: 2022-05-26 — End: 2022-05-31

## 2022-05-26 MED ORDER — MORPHINE 4 MG/ML INJECTION WRAPPER
4.0000 mg | INJECTION | INTRAMUSCULAR | Status: AC
Start: 2022-05-26 — End: 2022-05-26
  Administered 2022-05-26: 4 mg via INTRAMUSCULAR

## 2022-05-26 MED ORDER — PROCHLORPERAZINE EDISYLATE 10 MG/2 ML (5 MG/ML) INJECTION SOLUTION
INTRAMUSCULAR | Status: AC
Start: 2022-05-26 — End: 2022-05-26
  Filled 2022-05-26: qty 2

## 2022-05-26 MED ORDER — PROCHLORPERAZINE EDISYLATE 10 MG/2 ML (5 MG/ML) INJECTION SOLUTION
10.0000 mg | INTRAMUSCULAR | Status: AC
Start: 2022-05-26 — End: 2022-05-26
  Administered 2022-05-26: 10 mg via INTRAMUSCULAR

## 2022-05-26 MED ORDER — MORPHINE 4 MG/ML INJECTION WRAPPER
INJECTION | INTRAMUSCULAR | Status: AC
Start: 2022-05-26 — End: 2022-05-26
  Filled 2022-05-26: qty 1

## 2022-05-26 MED ORDER — DICYCLOMINE 20 MG TABLET
20.0000 mg | ORAL_TABLET | Freq: Four times a day (QID) | ORAL | 0 refills | Status: AC
Start: 2022-05-26 — End: 2022-06-05

## 2022-05-26 NOTE — ED Nurses Note (Signed)
Pt DC home with family. Prescriptions x 2 e-scribed. Verbal and written instructions given. Pt voices understanding.

## 2022-05-26 NOTE — ED Triage Notes (Signed)
PT states lower abd pain starting about 2 days ago and worsening to sharp pains last nt. Nausea, with no vomiting or diarrhea. Constipated for the last 3 days. Pt also states at times she has some flank pains and lower back pains, more so tonight on left side. Denies any burning with urination. Hx of ovarian cyst as well within the last 2 months.

## 2022-05-26 NOTE — ED Attending Note (Addendum)
[facility] emergency department         HISTORY OF PRESENT ILLNESS     Date:  05/26/2022  Patient's Name:  Mariah Griffin  Date of Birth:  [birth year]    Patient is a 36 year old past medical history of ovarian cysts.  Presents with diffuse generalized abdominal pain for the last 2 days associated nausea no vomiting patient has had no bowel movement for the last 2 days.  Patient denies any fevers or chills there has been no flank pain        Review of Systems     Review of Systems   Constitutional: Negative.    HENT: Negative.     Eyes: Negative.    Gastrointestinal:  Positive for abdominal distention, abdominal pain, constipation and nausea.   Musculoskeletal: Negative.    Neurological: Negative.    Psychiatric/Behavioral: Negative.     All other systems reviewed and are negative.      Previous History     Past Medical History:  Past Medical History:   Diagnosis Date    Ovarian cyst        Past Surgical History:  History reviewed. No pertinent surgical history.    Social History:  Social History     Tobacco Use    Smoking status: Every Day     Packs/day: .5     Types: Cigarettes    Smokeless tobacco: Never   Vaping Use    Vaping Use: Never used   Substance Use Topics    Alcohol use: Never    Drug use: Never     Social History     Substance and Sexual Activity   Drug Use Never       Family History:  No family history on file.    Medication History:  Current Outpatient Medications   Medication Sig    dicyclomine (BENTYL) 20 mg Oral Tablet Take 1 Tablet (20 mg total) by mouth Four times a day for 10 days    lactulose (ENULOSE) 10 gram/15 mL Oral Solution Take 30 mL by mouth Once a day for 5 days       Allergies:  Allergies   Allergen Reactions    Ciprofloxacin Rash    Naproxen Rash       Physical Exam     Vitals:    BP (!) 143/107   Pulse 95   Temp 36.8 C (98.2 F)   Resp (!) 22   Ht 1.575 m (5\' 2" )   Wt 72.6 kg (160 lb)   SpO2 99%   BMI 29.26 kg/m           Physical  Exam  Vitals and nursing note reviewed.   Constitutional:       General: She is not in acute distress.     Appearance: She is well-developed.   HENT:      Head: Normocephalic and atraumatic.      Mouth/Throat:      Mouth: Mucous membranes are moist.   Eyes:      Conjunctiva/sclera: Conjunctivae normal.   Cardiovascular:      Rate and Rhythm: Normal rate and regular rhythm.      Pulses: Normal pulses.      Heart sounds: No murmur heard.  Pulmonary:      Effort: Pulmonary effort is normal. No respiratory distress.      Breath sounds: Normal breath sounds.   Abdominal:      General: Bowel sounds  are normal. There is distension.      Palpations: Abdomen is soft.      Tenderness: There is abdominal tenderness.   Musculoskeletal:         General: No swelling.      Cervical back: Normal range of motion and neck supple.   Skin:     General: Skin is warm and dry.      Capillary Refill: Capillary refill takes less than 2 seconds.   Neurological:      Mental Status: She is alert.   Psychiatric:         Mood and Affect: Mood normal.         Diagnostic Studies/Treatment     Medications:  Medications Administered in the ED   morphine 4 mg/mL injection (4 mg IntraMUSCULAR Given 05/26/22 0602)   prochlorperazine (COMPAZINE) 5 mg/mL injection (10 mg IntraMUSCULAR Given 05/26/22 0602)       New Prescriptions    DICYCLOMINE (BENTYL) 20 MG ORAL TABLET    Take 1 Tablet (20 mg total) by mouth Four times a day for 10 days    LACTULOSE (ENULOSE) 10 GRAM/15 ML ORAL SOLUTION    Take 30 mL by mouth Once a day for 5 days       Labs:    Results for orders placed or performed during the hospital encounter of 05/26/22 (from the past 12 hour(s))   HCG, URINE QUALITATIVE, PREGNANCY   Result Value Ref Range    HCG URINE QUALITATIVE Negative    URINALYSIS, MACRO/MICRO   Result Value Ref Range    COLOR Light Yellow (A) Yellow    APPEARANCE Clear Clear    SPECIFIC GRAVITY 1.015 1.003 - 1.035    PH 6.0 4.6 - 8.0    LEUKOCYTES Negative Negative WBCs/uL     NITRITE Negative Negative    PROTEIN Negative Negative mg/dL    GLUCOSE Negative Negative mg/dL    KETONES Negative Negative mg/dL    BILIRUBIN Negative Negative mg/dL    BLOOD Negative Negative mg/dL    UROBILINOGEN 0.2 0.2 - 1.0 mg/dL        Radiology:  CT ABDOMEN PELVIS WO IV CONTRAST    CT ABDOMEN PELVIS WO IV CONTRAST   Final Result   Constipation without bowel obstruction or inflammatory process      Moderate gastric content          One or more dose reduction techniques were used (e.g., Automated exposure control, adjustment of the mA and/or kV according to patient size, use of iterative reconstruction technique).         Radiologist location ID: [id number]             ECG:  NONE            Differential diagnosis    1. Abdominal pain 2.  Appendicitis 3. Ovarian cyst 4. Constipation  Course/Disposition/Plan     Course:     ED Course as of 05/26/22 0713   Sat May 26, 2022   4163 CT scan abdomen pelvis.  No bowel obstruction no enteritis.  Moderate stools.  Will discharge patient on lactulose     Patient with diffuse abdominal pain and cramping patient will have CT scan of the abdomen and pelvis to rule out bowel obstruction or diverticulitis  Disposition:    Discharged    Condition at Disposition:   Stable    Follow up:   Primary physician    Schedule an appointment as soon as  possible for a visit in 3 days  If symptoms worsen      Clinical Impression:     Clinical Impression   Abdominal pain, unspecified abdominal location (Primary)   Constipation by delayed colonic transit         Tivis Ringer, MD

## 2022-05-26 NOTE — Discharge Instructions (Signed)
Encourage p.o. fluids

## 2023-03-17 ENCOUNTER — Emergency Department
Admission: EM | Admit: 2023-03-17 | Discharge: 2023-03-18 | Disposition: A | Discharge status: Other Acute Inpatient Hospital | Payer: MEDICAID | Attending: Family | Admitting: Family

## 2023-03-17 ENCOUNTER — Other Ambulatory Visit: Payer: Self-pay

## 2023-03-17 ENCOUNTER — Encounter (HOSPITAL_BASED_OUTPATIENT_CLINIC_OR_DEPARTMENT_OTHER): Payer: Self-pay

## 2023-03-17 DIAGNOSIS — Z3200 Encounter for pregnancy test, result unknown: Secondary | ICD-10-CM | POA: Insufficient documentation

## 2023-03-17 DIAGNOSIS — F1910 Other psychoactive substance abuse, uncomplicated: Secondary | ICD-10-CM

## 2023-03-17 DIAGNOSIS — F1920 Other psychoactive substance dependence, uncomplicated: Secondary | ICD-10-CM | POA: Insufficient documentation

## 2023-03-17 DIAGNOSIS — E876 Hypokalemia: Secondary | ICD-10-CM | POA: Insufficient documentation

## 2023-03-17 DIAGNOSIS — R4589 Other symptoms and signs involving emotional state: Secondary | ICD-10-CM

## 2023-03-17 DIAGNOSIS — F419 Anxiety disorder, unspecified: Secondary | ICD-10-CM | POA: Insufficient documentation

## 2023-03-17 DIAGNOSIS — Z1152 Encounter for screening for COVID-19: Secondary | ICD-10-CM | POA: Insufficient documentation

## 2023-03-17 DIAGNOSIS — R452 Unhappiness: Secondary | ICD-10-CM | POA: Insufficient documentation

## 2023-03-17 DIAGNOSIS — N390 Urinary tract infection, site not specified: Secondary | ICD-10-CM | POA: Insufficient documentation

## 2023-03-17 LAB — URINALYSIS, MICROSCOPIC

## 2023-03-17 LAB — COMPREHENSIVE METABOLIC PANEL, NON-FASTING
ALBUMIN/GLOBULIN RATIO: 1 (ref 0.8–1.4)
ALBUMIN: 3.5 g/dL (ref 3.4–5.0)
ALKALINE PHOSPHATASE: 57 U/L (ref 46–116)
ALT (SGPT): 15 U/L (ref <=78)
ANION GAP: 7 mmol/L (ref 4–13)
AST (SGOT): 12 U/L — ABNORMAL LOW (ref 15–37)
BILIRUBIN TOTAL: 0.5 mg/dL (ref 0.2–1.0)
BUN/CREA RATIO: 6
BUN: 6 mg/dL — ABNORMAL LOW (ref 7–18)
CALCIUM, CORRECTED: 8.9 mg/dL
CALCIUM: 8.5 mg/dL (ref 8.5–10.1)
CHLORIDE: 103 mmol/L (ref 98–107)
CO2 TOTAL: 30 mmol/L (ref 21–32)
CREATININE: 0.98 mg/dL (ref 0.55–1.02)
ESTIMATED GFR: 77 mL/min/{1.73_m2} (ref >59)
GLOBULIN: 3.6
GLUCOSE: 83 mg/dL (ref 74–106)
OSMOLALITY, CALCULATED: 276 mOsm/kg (ref 270–290)
POTASSIUM: 3.3 mmol/L — ABNORMAL LOW (ref 3.5–5.1)
PROTEIN TOTAL: 7.1 g/dL (ref 6.4–8.2)
SODIUM: 140 mmol/L (ref 136–145)

## 2023-03-17 LAB — URINALYSIS, MACRO/MICRO
BILIRUBIN: NEGATIVE mg/dL
BLOOD: NEGATIVE mg/dL
GLUCOSE: NEGATIVE mg/dL
KETONES: NEGATIVE mg/dL
NITRITE: NEGATIVE
PH: 6.5 (ref 4.6–8.0)
PROTEIN: NEGATIVE mg/dL
SPECIFIC GRAVITY: 1.015 (ref 1.003–1.035)
UROBILINOGEN: 0.2 mg/dL (ref 0.2–1.0)

## 2023-03-17 LAB — CBC WITH DIFF
BASOPHIL #: 0.04 10*3/uL (ref 0.00–0.30)
BASOPHIL %: 0 % (ref 0–3)
EOSINOPHIL #: 0.6 10*3/uL (ref 0.00–0.80)
EOSINOPHIL %: 5 % (ref 1–7)
HCT: 40.2 % (ref 37.0–47.0)
HGB: 13.8 g/dL (ref 12.5–16.0)
LYMPHOCYTE #: 3.06 10*3/uL (ref 1.10–5.00)
LYMPHOCYTE %: 25 % (ref 25–45)
MCH: 28.1 pg (ref 27.0–32.0)
MCHC: 34.3 g/dL (ref 32.0–36.0)
MCV: 81.9 fL (ref 78.0–99.0)
MONOCYTE #: 0.92 10*3/uL (ref 0.00–1.30)
MONOCYTE %: 8 % (ref 0–12)
MPV: 7.9 fL (ref 7.4–10.4)
NEUTROPHIL #: 7.49 10*3/uL (ref 1.80–8.40)
NEUTROPHIL %: 62 % (ref 40–76)
PLATELETS: 256 10*3/uL (ref 140–440)
RBC: 4.91 10*6/uL (ref 4.20–5.40)
RDW: 17.3 % — ABNORMAL HIGH (ref 11.6–14.8)
WBC: 12.1 10*3/uL — ABNORMAL HIGH (ref 4.0–10.5)

## 2023-03-17 LAB — DRUG SCREEN, NO CONFIRMATION, URINE
AMPHETAMINES URINE: NEGATIVE
BARBITURATES URINE: NEGATIVE
BENZODIAZEPINES URINE: NEGATIVE
CANNABINOIDS URINE: NEGATIVE
COCAINE METABOLITES URINE: NEGATIVE
METHADONE URINE: NEGATIVE
OPIATES URINE: NEGATIVE
PCP URINE: NEGATIVE

## 2023-03-17 LAB — COVID-19, FLU A/B, RSV RAPID BY PCR
INFLUENZA VIRUS TYPE A: NOT DETECTED
INFLUENZA VIRUS TYPE B: NOT DETECTED
RESPIRATORY SYNCTIAL VIRUS (RSV): NOT DETECTED
SARS-CoV-2: NOT DETECTED

## 2023-03-17 LAB — HCG QUALITATIVE PREGNANCY, SERUM: PREGNANCY, SERUM QUALITATIVE: NEGATIVE

## 2023-03-17 LAB — ETHANOL, SERUM/PLASMA: ETHANOL: <3 mg/dL (ref <=3)

## 2023-03-17 LAB — ACETAMINOPHEN LEVEL: ACETAMINOPHEN LEVEL: 0 ug/mL — ABNORMAL LOW (ref 10.0–30.0)

## 2023-03-17 LAB — SALICYLATE ACID LEVEL: SALICYLATE LEVEL: 3 mg/dL (ref 3–20)

## 2023-03-17 MED ORDER — CLONIDINE HCL 0.2 MG TABLET
0.2000 mg | ORAL_TABLET | ORAL | Status: AC
Start: 2023-03-17 — End: 2023-03-17
  Administered 2023-03-17: 0.2 mg via ORAL

## 2023-03-17 MED ORDER — CLONIDINE HCL 0.1 MG TABLET
0.1000 mg | ORAL_TABLET | Freq: Three times a day (TID) | ORAL | Status: DC
Start: 2023-03-17 — End: 2023-03-18
  Administered 2023-03-17: 0.1 mg via ORAL

## 2023-03-17 MED ORDER — NICOTINE 21 MG/24 HR DAILY TRANSDERMAL PATCH
MEDICATED_PATCH | TRANSDERMAL | Status: AC
Start: 2023-03-17 — End: 2023-03-17
  Filled 2023-03-17: qty 1

## 2023-03-17 MED ORDER — POTASSIUM BICARBONATE-CITRIC ACID 25 MEQ EFFERVESCENT TABLET
25.0000 meq | EFFERVESCENT_TABLET | ORAL | Status: AC
Start: 2023-03-17 — End: 2023-03-17
  Administered 2023-03-17: 25 meq via ORAL

## 2023-03-17 MED ORDER — NICOTINE 21 MG/24 HR DAILY TRANSDERMAL PATCH
21.0000 mg | MEDICATED_PATCH | TRANSDERMAL | Status: DC
Start: 2023-03-17 — End: 2023-03-18
  Administered 2023-03-17: 21 mg via TRANSDERMAL

## 2023-03-17 MED ORDER — LORAZEPAM 1 MG TABLET
ORAL_TABLET | ORAL | Status: AC
Start: 2023-03-17 — End: 2023-03-17
  Filled 2023-03-17: qty 1

## 2023-03-17 MED ORDER — LORAZEPAM 1 MG TABLET
1.0000 mg | ORAL_TABLET | Freq: Every evening | ORAL | Status: DC | PRN
Start: 2023-03-17 — End: 2023-03-18
  Administered 2023-03-17: 1 mg via ORAL

## 2023-03-17 MED ORDER — TRAMADOL 50 MG TABLET
50.0000 mg | ORAL_TABLET | ORAL | Status: AC
Start: 2023-03-17 — End: 2023-03-17
  Administered 2023-03-17: 50 mg via ORAL

## 2023-03-17 MED ORDER — CEPHALEXIN 500 MG CAPSULE
ORAL_CAPSULE | ORAL | Status: AC
Start: 2023-03-17 — End: 2023-03-17
  Filled 2023-03-17: qty 2

## 2023-03-17 MED ORDER — CLONIDINE HCL 0.1 MG TABLET
ORAL_TABLET | ORAL | Status: AC
Start: 2023-03-17 — End: 2023-03-17
  Filled 2023-03-17: qty 1

## 2023-03-17 MED ORDER — POTASSIUM BICARBONATE-CITRIC ACID 25 MEQ EFFERVESCENT TABLET
EFFERVESCENT_TABLET | ORAL | Status: AC
Start: 2023-03-17 — End: 2023-03-17
  Filled 2023-03-17: qty 1

## 2023-03-17 MED ORDER — TRAMADOL 50 MG TABLET
ORAL_TABLET | ORAL | Status: AC
Start: 2023-03-17 — End: 2023-03-17
  Filled 2023-03-17: qty 1

## 2023-03-17 MED ORDER — CLONIDINE HCL 0.2 MG TABLET
ORAL_TABLET | ORAL | Status: AC
Start: 2023-03-17 — End: 2023-03-17
  Filled 2023-03-17: qty 1

## 2023-03-17 MED ORDER — CEPHALEXIN 500 MG CAPSULE
1000.0000 mg | ORAL_CAPSULE | ORAL | Status: AC
Start: 2023-03-17 — End: 2023-03-17
  Administered 2023-03-17: 1000 mg via ORAL

## 2023-03-17 MED ORDER — CEPHALEXIN 500 MG CAPSULE
500.0000 mg | ORAL_CAPSULE | Freq: Four times a day (QID) | ORAL | 0 refills | Status: AC
Start: 2023-03-17 — End: 2023-03-27

## 2023-03-17 NOTE — ED Nurses Note (Signed)
Patient resting in bed with eyes closed and respirations even and unlabored.

## 2023-03-17 NOTE — ED Notes (Signed)
[facility] [place] mobile crisis unit called and stated that [facility] called them about patient because she does not meet inpatient criteria

## 2023-03-17 NOTE — ED Nurses Note (Signed)
All patient belongings placed in patient belongings bag and placed in locker #7. Patient placed in paper scrubs and informed on plan of care.

## 2023-03-17 NOTE — ED Notes (Signed)
Spoke with [facility], they said they are expecting someone to take their last female bed. She stated that if the patient has not came by midnight she would call the patient and if patient is not coming she would call us

## 2023-03-17 NOTE — ED Notes (Signed)
Patient speaking to [facility] at this time

## 2023-03-17 NOTE — ED Provider Notes (Signed)
[facility], [facility] Emergency Department  ED Primary Provider Note  History of Present Illness   Chief Complaint   Patient presents with    Detox Evaluation     Arrival: The patient arrived by Car  Mariah Griffin is a 37 y.o. female who had concerns including Detox Evaluation.  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 States she snorts it. May use it multiple times a day depending how much money she has. 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 Wants to get better . States no si no prior attempts.   Review of Systems   Constitutional: No fever, chills or weakness   Skin: No rash or diaphoresis  HENT: No headaches, or congestion  Eyes: No vision changes or photophobia   Cardio: No chest pain, palpitations or leg swelling   Respiratory: No cough, wheezing or SOB  GI:  No nausea, vomiting or stool changes  GU:  No dysuria, hematuria, or increased frequency  MSK: No muscle aches, joint or back pain  Neuro: No seizures, LOC, numbness, tingling, or focal weakness  Psychiatric: + depression, no  SI + substance abuse  All other systems reviewed and are negative.    History Reviewed This Encounter: all noted and reviewed.     Physical Exam   ED Triage Vitals   BP (Non-Invasive) 03/17/23 1732 (!) 153/88   Heart Rate 03/17/23 1732 85   Respiratory Rate 03/17/23 1732 (!) 22   Temperature 03/17/23 1732 36.6 C (97.8 F)   SpO2 03/17/23 1732 100 %   Weight 03/17/23 1740 72.6 kg (160 lb)   Height 03/17/23 1732 1.575 m (5\' 2" )       Constitutional:  37 y.o. female who appears in no distress. Normal color, no cyanosis.   HENT:   Head: Normocephalic and atraumatic.   Mouth/Throat: Oropharynx is clear and moist.   Eyes: EOMI, PERRL   Neck: Trachea midline. Neck supple.  Cardiovascular: RRR, No murmurs, rubs or gallops. Intact distal pulses.  Pulmonary/Chest: BS equal bilaterally. No respiratory distress. No wheezes, rales or chest tenderness.   Abdominal: Bowel sounds  present and normal. Abdomen soft, no tenderness, no rebound and no guarding.  Back: No midline spinal tenderness, no paraspinal tenderness, no CVA tenderness.           Musculoskeletal: No edema, tenderness or deformity.  Skin: warm and dry. No rash, erythema, pallor or cyanosis  Psychiatric: tearful anxious. Sad. States no si no hi. States she is sad she has lost everything to drugs and she wants help.  Neurological: Patient keenly alert and responsive, easily able to raise eyebrows, facial muscles/expressions symmetric, speaking in fluent sentences, moving all extremities equally and fully, normal gait  Patient Data     Labs Ordered/Reviewed   COMPREHENSIVE METABOLIC PANEL, NON-FASTING - Abnormal; Notable for the following components:       Result Value    POTASSIUM 3.3 (*)     BUN 6 (*)     AST (SGOT) 12 (*)     All other components within normal limits    Narrative:     Estimated Glomerular Filtration Rate (eGFR) is calculated using the CKD-EPI (2021) equation, intended for patients 60 years of age and older. If gender is not documented or "unknown", there will be no eGFR calculation.   ACETAMINOPHEN LEVEL - Abnormal; Notable for the following components:    ACETAMINOPHEN LEVEL 0.0 (*)     All  other components within normal limits   CBC WITH DIFF - Abnormal; Notable for the following components:    WBC 12.1 (*)     RDW 17.3 (*)     All other components within normal limits   URINALYSIS, MACRO/MICRO - Abnormal; Notable for the following components:    LEUKOCYTES Small (*)     All other components within normal limits   URINALYSIS, MICROSCOPIC - Abnormal; Notable for the following components:    RBCS 3-5 (*)     BACTERIA Rare (*)     WBCS 11-15 (*)     WHITE BLOOD CELL CLUMP Rare (*)     SQUAMOUS EPITHELIAL Many (*)     All other components within normal limits   SALICYLATE ACID LEVEL - Normal   ETHANOL, SERUM/PLASMA - Normal   HCG QUALITATIVE PREGNANCY, SERUM - Normal    Narrative:     [id number] 07/12/24   DRUG  SCREEN, NO CONFIRMATION, URINE - Normal   COVID-19, FLU A/B, RSV RAPID BY PCR - Normal    Narrative:     Results are for the simultaneous qualitative identification of SARS-CoV-2 (formerly 2019-nCoV), Influenza A, Influenza B, and RSV RNA. These etiologic agents are generally detectable in nasopharyngeal and nasal swabs during the ACUTE PHASE of infection. Hence, this test is intended to be performed on respiratory specimens collected from individuals with signs and symptoms of upper respiratory tract infection who meet Centers for Disease Control and Prevention (CDC) clinical and/or epidemiological criteria for Coronavirus Disease 2019 (COVID-19) testing. CDC COVID-19 criteria for testing on human specimens is available at [facility] webpage information for Healthcare Professionals: Coronavirus Disease 2019 (COVID-19) ([email or web address]).     False-negative results may occur if the virus has genomic mutations, insertions, deletions, or rearrangements or if performed very early in the course of illness. Otherwise, negative results indicate virus specific RNA targets are not detected, however negative results do not preclude SARS-CoV-2 infection/COVID-19, Influenza, or Respiratory syncytial virus infection. Results should not be used as the sole basis for patient management decisions. Negative results must be combined with clinical observations, patient history, and epidemiological information. If upper respiratory tract infection is still suspected based on exposure history together with other clinical findings, re-testing should be considered.    Test methodology:   Cepheid Xpert Xpress SARS-CoV-2/Flu/RSV Assay real-time polymerase chain reaction (RT-PCR) test on the GeneXpert Dx and Xpert Xpress systems.   URINE CULTURE,ROUTINE   CBC/DIFF    Narrative:     The following orders were created for panel order CBC/DIFF.  Procedure                               Abnormality          Status                     ---------                               -----------         ------                     CBC WITH BMWU[[id number]]                Abnormal            Final result  Please view results for these tests on the individual orders.   URINALYSIS WITH REFLEX MICROSCOPIC AND CULTURE IF POSITIVE    Narrative:     The following orders were created for panel order URINALYSIS WITH REFLEX MICROSCOPIC AND CULTURE IF POSITIVE.  Procedure                               Abnormality         Status                     ---------                               -----------         ------                     URINALYSIS, MACRO/MICRO[[id number]]      Abnormal            Final result               URINALYSIS, MICROSCOPIC[[id number]]      Abnormal            Final result                 Please view results for these tests on the individual orders.     No orders to display     Medical Decision Making   Diff dx drug abuse, depression , drug addiction.   Tox neg but our drug screens do not check for fentanyl. Treated UTI keflex here and rx.   Pt is  medically cleared for psych care.   Medications Administered in the ED   nicotine (NICODERM CQ) transdermal patch (mg/24 hr) (21 mg Transdermal Patch Applied 03/17/23 1805)   cloNIDine (CATAPRES) tablet (0.2 mg Oral Given 03/17/23 1805)   cephalexin (KEFLEX) capsule (1,000 mg Oral Given 03/17/23 1814)   potassium bicarbonate-citric acid (EFFER-K) effervescent tablet (25 mEq Oral Given 03/17/23 1833)     Clinical Impression   Substance abuse (CMS HCC) (Primary)   Drug addiction (CMS HCC)   Sadness   Anxiety   UTI (urinary tract infection)   Hypokalemia       Disposition: Psych Facility

## 2023-03-17 NOTE — ED Triage Notes (Addendum)
Patient requesting medical clearance for detox has spoken with someone and they referred her to [place]. Patient uses fentanyl last use was last night and she snorted it. Patient has been using fentanyl for about 2 years.

## 2023-03-17 NOTE — Progress Notes (Signed)
Pt continues to [facility] for [facility] pt stating she dont meet criteria. She has been resting well since clonidine no distress. No sweats, no emesis no withdrawal symptoms noted. At 2300 care left to dr. Tawanna Solo.

## 2023-03-17 NOTE — Progress Notes (Signed)
Pt states she dont know how much longer she can stand this. [facility] informed pt edging and anxious. Further meds ordered. Staff stated they are waiting until midnight to see if a girl shows up for the one female bed.

## 2023-03-18 NOTE — ED Nurses Note (Signed)
[facility] squad here for transport at this time. All patient belongings and prescription sent with patient by squad.

## 2023-03-18 NOTE — ED Notes (Signed)
[facility], [facility] Emergency Department  Peer Recovery Coach Assessment        Plan  Was the patient referred to treatment?: Yes  What level of treatment was the patient referred to?: Medically Managed Inpatient  Program name: [facility]  Program phone number: [phone]    Was patient referred to physician for Buprenorphine Assessment in the ED?: No    Did patient receive Narcan in the ED?: No         Follow-up  Patient admitted for treatment?: Yes  Treatment center patient admitted to:: [facility]  Date of intake appointment: 03/17/23  Did patient attend intake appointment?: Yes  Need for additional follow-up?: Yes       Haynes Bast, Peer Recovery Coach 03/18/2023 09:18

## 2023-03-18 NOTE — ED Notes (Signed)
[facility] called, patient is accepted to Crisis Unit by Randon Goldsmith to 2B

## 2023-03-18 NOTE — ED Nurses Note (Signed)
Report called to [place] at [facility].

## 2023-03-18 NOTE — ED Attending Note (Signed)
Patient was transferred to my service at 11:00 p.m.Marland Kitchen  Patient being medically cleared awaiting assessment from [facility] for inpatient detox.        Patient was accepted for inpatient detox management at [facility] by Dr. Earlene Plater

## 2023-03-20 LAB — URINE CULTURE,ROUTINE: URINE CULTURE: 5000 — AB

## 2023-05-06 ENCOUNTER — Encounter (HOSPITAL_BASED_OUTPATIENT_CLINIC_OR_DEPARTMENT_OTHER): Payer: Self-pay

## 2023-05-06 ENCOUNTER — Other Ambulatory Visit: Payer: Self-pay

## 2023-05-06 ENCOUNTER — Emergency Department
Admission: EM | Admit: 2023-05-06 | Discharge: 2023-05-06 | Disposition: A | Discharge status: Home / Self Care | Payer: 59 | Attending: Emergency Medicine | Admitting: Emergency Medicine

## 2023-05-06 DIAGNOSIS — B9789 Other viral agents as the cause of diseases classified elsewhere: Secondary | ICD-10-CM | POA: Insufficient documentation

## 2023-05-06 DIAGNOSIS — Z1152 Encounter for screening for COVID-19: Secondary | ICD-10-CM | POA: Insufficient documentation

## 2023-05-06 DIAGNOSIS — J028 Acute pharyngitis due to other specified organisms: Secondary | ICD-10-CM | POA: Insufficient documentation

## 2023-05-06 DIAGNOSIS — Z3200 Encounter for pregnancy test, result unknown: Secondary | ICD-10-CM | POA: Insufficient documentation

## 2023-05-06 DIAGNOSIS — I10 Essential (primary) hypertension: Secondary | ICD-10-CM | POA: Insufficient documentation

## 2023-05-06 DIAGNOSIS — J029 Acute pharyngitis, unspecified: Secondary | ICD-10-CM

## 2023-05-06 DIAGNOSIS — J069 Acute upper respiratory infection, unspecified: Secondary | ICD-10-CM

## 2023-05-06 LAB — COVID-19, FLU A/B, RSV RAPID BY PCR
INFLUENZA VIRUS TYPE A: NOT DETECTED
INFLUENZA VIRUS TYPE B: NOT DETECTED
RESPIRATORY SYNCTIAL VIRUS (RSV): NOT DETECTED
SARS-CoV-2: NOT DETECTED

## 2023-05-06 LAB — HCG, URINE QUALITATIVE, PREGNANCY: HCG URINE QUALITATIVE: NEGATIVE

## 2023-05-06 LAB — RAPID THROAT SCREEN, STREPTOCOCCUS, WITH REFLEX: THROAT RAPID SCREEN, STREPTOCOCCUS: NEGATIVE

## 2023-05-06 MED ORDER — BENZONATATE 100 MG CAPSULE
ORAL_CAPSULE | ORAL | Status: AC
Start: 2023-05-06 — End: 2023-05-06
  Filled 2023-05-06: qty 1

## 2023-05-06 MED ORDER — LISINOPRIL 5 MG TABLET
10.0000 mg | ORAL_TABLET | ORAL | Status: AC
Start: 2023-05-06 — End: 2023-05-06
  Administered 2023-05-06: 10 mg via ORAL

## 2023-05-06 MED ORDER — OXYMETAZOLINE 0.05 % NASAL SPRAY
2.0000 | NASAL | Status: AC
Start: 2023-05-06 — End: 2023-05-06
  Administered 2023-05-06: 2 via NASAL

## 2023-05-06 MED ORDER — BENZONATATE 100 MG CAPSULE
100.0000 mg | ORAL_CAPSULE | ORAL | Status: AC
Start: 2023-05-06 — End: 2023-05-06
  Administered 2023-05-06: 100 mg via ORAL

## 2023-05-06 MED ORDER — BENZONATATE 100 MG CAPSULE
100.0000 mg | ORAL_CAPSULE | Freq: Three times a day (TID) | ORAL | 0 refills | Status: AC | PRN
Start: 2023-05-06 — End: ?

## 2023-05-06 MED ORDER — LISINOPRIL 10 MG TABLET
10.0000 mg | ORAL_TABLET | Freq: Every day | ORAL | 4 refills | Status: AC
Start: 2023-05-06 — End: ?

## 2023-05-06 MED ORDER — LISINOPRIL 5 MG TABLET
ORAL_TABLET | ORAL | Status: AC
Start: 2023-05-06 — End: 2023-05-06
  Filled 2023-05-06: qty 2

## 2023-05-06 MED ORDER — OXYMETAZOLINE 0.05 % NASAL SPRAY
NASAL | Status: AC
Start: 2023-05-06 — End: 2023-05-06
  Filled 2023-05-06: qty 30

## 2023-05-06 MED ORDER — DEXAMETHASONE SODIUM PHOSPHATE 4 MG/ML INJECTION SOLUTION
INTRAMUSCULAR | Status: AC
Start: 2023-05-06 — End: 2023-05-06
  Filled 2023-05-06: qty 1

## 2023-05-06 MED ORDER — DEXAMETHASONE SODIUM PHOSPHATE 4 MG/ML INJECTION SOLUTION
4.0000 mg | INTRAMUSCULAR | Status: AC
Start: 2023-05-06 — End: 2023-05-06
  Administered 2023-05-06: 4 mg via INTRAMUSCULAR

## 2023-05-06 NOTE — Discharge Instructions (Signed)
Your evaluation today did not reveal critical condition requiring immediate hospitalization.  You may use Afrin for up to 3 days for your nasal stuffiness.  Alternate between Tylenol and ibuprofen every 4-6 hours for body aches fevers.  Your blood pressure has been elevated.  I would recommend honey based cough drops for cough and you have Tessalon available.  Please purchase a blood pressure machine from [facility] or any other drug store and measure blood pressure twice a day.  Write down these numbers.  You should see primary care ideally within next 3-5 days.  Return to emergency department if you have difficulty breathing uncontrollable fevers chills or any other concerning symptoms.  Thank you for visiting [facility]

## 2023-05-06 NOTE — ED Nurses Note (Signed)
Verbalized understanding of discharge instructions, rx education, and follow up information. VSS.  AVS given to patient.  Left ER with no further complaints.

## 2023-05-06 NOTE — ED Triage Notes (Signed)
Pt c/o sore throat, blisters in throat,runny nose, cough and congestion

## 2023-05-07 NOTE — ED Provider Notes (Addendum)
[facility], [facility] - Emergency Department  ED Primary Provider Note  HPI:  Mariah Griffin is a 37 y.o. female     Patient reports sore throat blisters and throat runny nose cough congestion symptoms been ongoing for about 2 days.  No fevers no chills no trouble breathing no cough.  No rash.  Patient denies travel.  Multiple sick contacts.  COVID vaccinated.  Full code.    Patient does have hypertension states it is untreated does not have primary care.    ROS review and negative aside from stated in HPI.    Physical Exam:  ED Triage Vitals   BP (Non-Invasive) 05/06/23 2158 (!) 176/106   Heart Rate 05/06/23 2158 94   Respiratory Rate 05/06/23 2158 20   Temperature 05/06/23 2158 36.6 C (97.9 F)   SpO2 05/06/23 2158 100 %   Weight 05/06/23 2158 90.3 kg (199 lb)   Height 05/06/23 2208 1.6 m (5\' 3" )     No acute distress.  Patient awake alert oriented x3.  Mood is appropriate.  Pupils 3 mm equal round reactive.  Extraocular movements are intact.  Oropharynx is clear.  No redness or lesions on posterior pharynx.  Mucous membranes moist.  Trachea midline.  Neck is supple.  Heart has regular rate and rhythm without significant murmurs rubs or gallops.  Lungs are clear to auscultation.  Abdomen soft nontender, nondistended.  Moving all extremities without difficulty.  No rash no edema.      Patient data:  Labs Ordered/Reviewed   RAPID THROAT SCREEN, STREPTOCOCCUS, WITH REFLEX - Normal    Narrative:     Walk-Away Mode   COVID-19, FLU A/B, RSV RAPID BY PCR - Normal    Narrative:     Results are for the simultaneous qualitative identification of SARS-CoV-2 (formerly 2019-nCoV), Influenza A, Influenza B, and RSV RNA. These etiologic agents are generally detectable in nasopharyngeal and nasal swabs during the ACUTE PHASE of infection. Hence, this test is intended to be performed on respiratory specimens collected from individuals with signs and symptoms of upper respiratory tract infection who meet  Centers for Disease Control and Prevention (CDC) clinical and/or epidemiological criteria for Coronavirus Disease 2019 (COVID-19) testing. CDC COVID-19 criteria for testing on human specimens is available at [facility] webpage information for Healthcare Professionals: Coronavirus Disease 2019 (COVID-19) ([email or web address]).     False-negative results may occur if the virus has genomic mutations, insertions, deletions, or rearrangements or if performed very early in the course of illness. Otherwise, negative results indicate virus specific RNA targets are not detected, however negative results do not preclude SARS-CoV-2 infection/COVID-19, Influenza, or Respiratory syncytial virus infection. Results should not be used as the sole basis for patient management decisions. Negative results must be combined with clinical observations, patient history, and epidemiological information. If upper respiratory tract infection is still suspected based on exposure history together with other clinical findings, re-testing should be considered.    Test methodology:   Cepheid Xpert Xpress SARS-CoV-2/Flu/RSV Assay real-time polymerase chain reaction (RT-PCR) test on the GeneXpert Dx and Xpert Xpress systems.   THROAT CULTURE, BETA HEMOLYTIC STREPTOCOCCUS   HCG, URINE QUALITATIVE, PREGNANCY     No orders to display       MDM:    Sore throat runny nose blisters.  Suspect viral URI.  Blood pressure is 176/106. Marland Kitchen  Patient is COVID negative strep negative.  I did review these findings with the patient and answered their questions.  We will treat conservatively as a  shared decision.  Patient given dexamethasone Tessalon and Afrin. Will start patient on lisinopril as she reports uncontrolled hypertension and lacks a primary care provider.  Did give information concerning primary care.  I gave strict return to ED instructions in both a verbal and written manner.  Patient is comfortable with plan of  discharge.    ED Course as of 05/07/23 2130   Tue May 07, 2023   2129 Follow up call this evening.  Patient has yet to fill prescriptions or reach out to primary care I did encourage her to do so and stressed low threshold to return to ED.  She does state she is feeling better.      Discharged  Clinical Impression   Viral pharyngitis (Primary)   Viral URI with cough   Hypertension, unspecified type     Medications Administered in the ED   dexAMETHasone 4 mg/mL injection (4 mg IntraMUSCULAR Given 05/06/23 2332)   benzonatate (TESSALON) capsule (100 mg Oral Given 05/06/23 2332)   oxymetazoline (AFRIN) 0.05% nasal spray (2 Sprays INTRANASAL Given 05/06/23 2332)   lisinopril (PRINIVIL) tablet (10 mg Oral Given 05/06/23 2332)        Current Discharge Medication List        START taking these medications.        Details   benzonatate 100 mg Capsule  Commonly known as: TESSALON   100 mg, Oral, EVERY 8 HOURS PRN  Qty: 12 Capsule  Refills: 0     lisinopriL 10 mg Tablet  Commonly known as: PRINIVIL   10 mg, Oral, DAILY  Qty: 90 Tablet  Refills: 4

## 2023-05-09 LAB — THROAT CULTURE, BETA HEMOLYTIC STREPTOCOCCUS: THROAT CULTURE: NORMAL

## 2023-05-10 ENCOUNTER — Telehealth (HOSPITAL_BASED_OUTPATIENT_CLINIC_OR_DEPARTMENT_OTHER): Payer: Self-pay

## 2023-05-10 NOTE — ED Notes (Signed)
Follow up 2 for initial BI. No answer, left message for call back.

## 2023-05-16 ENCOUNTER — Telehealth (HOSPITAL_BASED_OUTPATIENT_CLINIC_OR_DEPARTMENT_OTHER): Payer: Self-pay

## 2023-05-16 NOTE — ED Notes (Signed)
Follow up 3 for initial BI. No answer, left message for call back. No further follow-up required.
